# Patient Record
Sex: Female | Born: 1965 | Race: Black or African American | Hispanic: No | Marital: Married | Smoking: Never smoker
Health system: Southern US, Community
[De-identification: ages and names within clinical notes are randomized; demographics above are authoritative.]

## PROBLEM LIST (undated history)

## (undated) DIAGNOSIS — E559 Vitamin D deficiency, unspecified: Secondary | ICD-10-CM

## (undated) DIAGNOSIS — Q8740 Marfan's syndrome, unspecified: Secondary | ICD-10-CM

## (undated) DIAGNOSIS — D649 Anemia, unspecified: Secondary | ICD-10-CM

## (undated) DIAGNOSIS — K5190 Ulcerative colitis, unspecified, without complications: Secondary | ICD-10-CM

## (undated) DIAGNOSIS — N6009 Solitary cyst of unspecified breast: Secondary | ICD-10-CM

## (undated) DIAGNOSIS — N200 Calculus of kidney: Secondary | ICD-10-CM

## (undated) DIAGNOSIS — I719 Aortic aneurysm of unspecified site, without rupture: Secondary | ICD-10-CM

## (undated) HISTORY — DX: Vitamin D deficiency, unspecified: E55.9

## (undated) HISTORY — PX: HERNIA REPAIR: SHX51

## (undated) HISTORY — DX: Solitary cyst of unspecified breast: N60.09

## (undated) HISTORY — PX: ABDOMINAL AORTIC ANEURYSM REPAIR: SUR1152

## (undated) HISTORY — DX: Ulcerative colitis, unspecified, without complications: K51.90

## (undated) HISTORY — DX: Calculus of kidney: N20.0

## (undated) HISTORY — DX: Aortic aneurysm of unspecified site, without rupture: I71.9

---

## 2010-08-23 HISTORY — PX: COLONOSCOPY: SHX174

## 2014-08-23 DIAGNOSIS — N6009 Solitary cyst of unspecified breast: Secondary | ICD-10-CM

## 2014-08-23 HISTORY — DX: Solitary cyst of unspecified breast: N60.09

## 2015-03-20 ENCOUNTER — Ambulatory Visit
Admission: EM | Admit: 2015-03-20 | Discharge: 2015-03-20 | Disposition: A | Discharge status: Home / Self Care | Payer: BC Managed Care – PPO | Attending: Internal Medicine | Admitting: Internal Medicine

## 2015-03-20 ENCOUNTER — Encounter: Payer: Self-pay | Admitting: Emergency Medicine

## 2015-03-20 DIAGNOSIS — R319 Hematuria, unspecified: Secondary | ICD-10-CM | POA: Insufficient documentation

## 2015-03-20 DIAGNOSIS — N390 Urinary tract infection, site not specified: Secondary | ICD-10-CM

## 2015-03-20 DIAGNOSIS — K2100 Gastro-esophageal reflux disease with esophagitis, without bleeding: Secondary | ICD-10-CM

## 2015-03-20 DIAGNOSIS — R310 Gross hematuria: Secondary | ICD-10-CM

## 2015-03-20 HISTORY — DX: Anemia, unspecified: D64.9

## 2015-03-20 LAB — URINALYSIS COMPLETE WITH MICROSCOPIC (ARMC ONLY)
Glucose, UA: NEGATIVE mg/dL
Leukocytes, UA: NEGATIVE
Nitrite: NEGATIVE
PROTEIN: 100 mg/dL — AB
Specific Gravity, Urine: 1.02 (ref 1.005–1.030)
WBC, UA: NONE SEEN WBC/hpf (ref <3)
pH: 5.5 (ref 5.0–8.0)

## 2015-03-20 MED ORDER — NITROFURANTOIN MONOHYD MACRO 100 MG PO CAPS: 100.0000 mg | ORAL_CAPSULE | Freq: Two times a day (BID) | ORAL | Status: DC

## 2015-03-20 MED ORDER — OMEPRAZOLE 40 MG PO CPDR: 40.0000 mg | DELAYED_RELEASE_CAPSULE | Freq: Two times a day (BID) | ORAL | Status: DC

## 2015-03-20 MED ORDER — FLUCONAZOLE 200 MG PO TABS: 200.0000 mg | ORAL_TABLET | Freq: Once | ORAL | Status: DC

## 2015-03-20 NOTE — Discharge Instructions (Signed)
Prescriptions for omeprazole (stomach acid medicine), macrobid (antibiotic) and diflucan (yeast medicine) were sent to the [facility] in [place].

## 2015-03-20 NOTE — ED Provider Notes (Signed)
CSN: [id number]     Arrival date & time 03/20/15  1539 History   First MD Initiated Contact with Patient 03/20/15 1648     Chief Complaint  Patient presents with  . Hematuria    HPI  49yo lady with recent dx anemia; presents with hematuria, dysuria, today. No fever.  Past Medical History  Diagnosis Date  . Anemia    History reviewed. No pertinent past surgical history. History reviewed. No pertinent family history. History  Substance Use Topics  . Smoking status: Never Smoker   . Smokeless tobacco: Not on file  . Alcohol Use: No    Review of Systems  All other systems reviewed and are negative.   Allergies  Erythromycin and Penicillins  Home Medications   Prior to Admission medications   Medication Sig Start Date End Date Taking? Authorizing Provider  Cholecalciferol (VITAMIN D-3) 1000 UNITS CAPS Take 1 capsule by mouth daily.   Yes Historical Provider, MD  ferrous fumarate (HEMOCYTE - 106 MG FE) 325 (106 FE) MG TABS tablet Take 1 tablet by mouth 3 (three) times daily.   Yes Historical Provider, MD  Vitamin D, Ergocalciferol, (DRISDOL) 50000 UNITS CAPS capsule Take 50,000 Units by mouth every 7 (seven) days.   Yes Historical Provider, MD                        BP 120/79 mmHg  Pulse 87  Temp(Src) 98.2 F (36.8 C) (Oral)  Resp 16  SpO2 100% Physical Exam  Constitutional: She is oriented to person, place, and time. No distress.  Alert, nicely groomed  HENT:  Head: Atraumatic.  Eyes:  Conjugate gaze, no eye redness/drainage  Neck: Neck supple.  Cardiovascular: Normal rate and regular rhythm.   Pulmonary/Chest: No respiratory distress.  Lungs clear, symmetric breath sounds  Abdominal: Soft. She exhibits no distension. There is no tenderness.  Musculoskeletal: Normal range of motion.  No leg swelling  Neurological: She is alert and oriented to person, place, and time.  Skin: Skin is warm and dry.  No cyanosis  Nursing note and vitals reviewed.   ED Course   Procedures Results for orders placed or performed during the hospital encounter of 03/20/15  Urinalysis complete, with microscopic  Result Value Ref Range   Color, Urine RED (A) YELLOW   APPearance CLOUDY (A) CLEAR   Glucose, UA NEGATIVE NEGATIVE mg/dL   Bilirubin Urine 1+ (A) NEGATIVE   Ketones, ur TRACE (A) NEGATIVE mg/dL   Specific Gravity, Urine 1.020 1.005 - 1.030   Hgb urine dipstick 3+ (A) NEGATIVE   pH 5.5 5.0 - 8.0   Protein, ur 100 (A) NEGATIVE mg/dL   Nitrite NEGATIVE NEGATIVE   Leukocytes, UA NEGATIVE NEGATIVE   RBC / HPF TOO NUMEROUS TO COUNT <3 RBC/hpf   WBC, UA NONE SEEN <3 WBC/hpf   Bacteria, UA FEW (A) RARE   Squamous Epithelial / LPF 0-5 (A) RARE   Budding Yeast PRESENT    Urine culture pending.  MDM   1. Hematuria, gross   2. UTI (lower urinary tract infection)   3. Gastroesophageal reflux disease with esophagitis    Discharge Medication List as of 03/20/2015  5:14 PM    START taking these medications   Details  fluconazole (DIFLUCAN) 200 MG tablet Take 1 tablet (200 mg total) by mouth once., Starting 03/20/2015, Normal    nitrofurantoin, macrocrystal-monohydrate, (MACROBID) 100 MG capsule Take 1 capsule (100 mg total) by mouth 2 (two) times daily.,  Starting 03/20/2015, Until Discontinued, Normal    omeprazole (PRILOSEC) 40 MG capsule Take 1 capsule (40 mg total) by mouth 2 (two) times daily before a meal., Starting 03/20/2015, Until Discontinued, Normal       rx as above.   Would like to establish with a local pcp; given Dr Chinita Greenland office number to facilitate.    Sherlene Shams, MD 03/30/15 [phone]

## 2015-03-20 NOTE — ED Notes (Signed)
Pt states that she noted blood in her urine today, she states that she was at her PCP on 03/18/2015 and they told her she was anemic. Pt does c/o burning and irritation in her urethra per pt

## 2015-03-24 ENCOUNTER — Telehealth: Payer: Self-pay

## 2015-03-24 NOTE — ED Notes (Signed)
Patient called for urine culture result. Not able to find that culture had been ordered. Dr. Valere Dross working and given update. She recommends patient get established with a Primary Care doctor. Dr. Chinita Greenland telephone number given to her. Not able to collect another urine for culture at this time due to patient being on Nitrofurantoin.

## 2015-03-26 ENCOUNTER — Encounter: Payer: Self-pay | Admitting: Family Medicine

## 2015-03-26 ENCOUNTER — Ambulatory Visit (INDEPENDENT_AMBULATORY_CARE_PROVIDER_SITE_OTHER): Payer: BC Managed Care – PPO | Admitting: Family Medicine

## 2015-03-26 VITALS — BP 120/82 | HR 80 | Ht 69.0 in | Wt 281.0 lb

## 2015-03-26 DIAGNOSIS — N200 Calculus of kidney: Secondary | ICD-10-CM

## 2015-03-26 DIAGNOSIS — E559 Vitamin D deficiency, unspecified: Secondary | ICD-10-CM | POA: Diagnosis not present

## 2015-03-26 DIAGNOSIS — D509 Iron deficiency anemia, unspecified: Secondary | ICD-10-CM | POA: Diagnosis not present

## 2015-03-26 DIAGNOSIS — R319 Hematuria, unspecified: Secondary | ICD-10-CM | POA: Diagnosis not present

## 2015-03-26 DIAGNOSIS — K51919 Ulcerative colitis, unspecified with unspecified complications: Secondary | ICD-10-CM | POA: Diagnosis not present

## 2015-03-26 LAB — POCT URINALYSIS DIPSTICK

## 2015-03-26 MED ORDER — CIPROFLOXACIN HCL 250 MG PO TABS: 250.0000 mg | ORAL_TABLET | Freq: Two times a day (BID) | ORAL | Status: DC

## 2015-03-27 DIAGNOSIS — N200 Calculus of kidney: Secondary | ICD-10-CM | POA: Insufficient documentation

## 2015-03-27 DIAGNOSIS — D509 Iron deficiency anemia, unspecified: Secondary | ICD-10-CM | POA: Insufficient documentation

## 2015-03-27 DIAGNOSIS — E559 Vitamin D deficiency, unspecified: Secondary | ICD-10-CM | POA: Insufficient documentation

## 2015-03-27 DIAGNOSIS — K5190 Ulcerative colitis, unspecified, without complications: Secondary | ICD-10-CM | POA: Insufficient documentation

## 2015-03-27 NOTE — Progress Notes (Signed)
Date:  03/26/2015   Name:  Cynthia Duncan   DOB:  Dec 27, 1965   MRN:  [id number]  PCP:  Adline Potter, MD    Chief Complaint: Urinary Tract Infection   History of Present Illness:  This is a 49 y.o. female with hx kidney stones recently diagnosed with iron deficiency anemia develeoped gross hematuria last week, seen [facility] and placed on Macrobid and fluconazole but no cx sent. Feeling better but reddish urine and slight dysuria persist. No flank pain noted. Lives in [place] and would like to get specialty care there.  Review of Systems:  Review of Systems  Constitutional: Negative for fever and appetite change.  HENT: Negative for ear pain and sore throat.   Eyes: Negative for pain.  Respiratory: Negative for cough and shortness of breath.   Cardiovascular: Negative for chest pain and leg swelling.  Gastrointestinal: Negative for abdominal pain.  Endocrine: Negative for polyuria.  Genitourinary: Negative for flank pain and pelvic pain.  Musculoskeletal: Negative for back pain.  Skin: Negative for rash.  Neurological: Negative for syncope and headaches.  Hematological: Negative for adenopathy.  Psychiatric/Behavioral: Negative for confusion.    Patient Active Problem List   Diagnosis Date Noted  . Ulcerative colitis 03/27/2015  . Kidney stones 03/27/2015  . Iron deficiency anemia 03/27/2015  . Vitamin D deficiency 03/27/2015    Prior to Admission medications   Medication Sig Start Date End Date Taking? Authorizing Provider  Cholecalciferol (VITAMIN D-3) 1000 UNITS CAPS Take 1 capsule by mouth daily.   Yes Historical Provider, MD  ferrous fumarate (HEMOCYTE - 106 MG FE) 325 (106 FE) MG TABS tablet Take 1 tablet by mouth 3 (three) times daily.   Yes Historical Provider, MD  fluconazole (DIFLUCAN) 200 MG tablet Take 1 tablet (200 mg total) by mouth once. 03/20/15  Yes Sherlene Shams, MD  omeprazole (PRILOSEC) 40 MG capsule Take 1 capsule (40 mg total) by mouth 2 (two) times daily  before a meal. 03/20/15  Yes Sherlene Shams, MD  ciprofloxacin (CIPRO) 250 MG tablet Take 1 tablet (250 mg total) by mouth 2 (two) times daily. 03/26/15   Adline Potter, MD    Allergies  Allergen Reactions  . Erythromycin   . Penicillins     Past Surgical History  Procedure Laterality Date  . Colonoscopy  2012    had in [place].- Dx- Ulcerative colitis    History  Substance Use Topics  . Smoking status: Never Smoker   . Smokeless tobacco: Not on file  . Alcohol Use: No    Family History  Problem Relation Age of Onset  . Heart disease Father     Medication list has been reviewed and updated.  Physical Examination: BP 120/82 mmHg  Pulse 80  Ht 5\' 9"  (1.753 m)  Wt 281 lb (127.461 kg)  BMI 41.48 kg/m2  LMP 02/27/2015 (Approximate)  Physical Exam  Constitutional: She is oriented to person, place, and time. She appears well-developed and well-nourished.  HENT:  Head: Normocephalic and atraumatic.  Left Ear: External ear normal.  Eyes: Conjunctivae are normal. Pupils are equal, round, and reactive to light.  Neck: Neck supple. No thyromegaly present.  Cardiovascular: Normal rate, regular rhythm and normal heart sounds.   Pulmonary/Chest: Effort normal and breath sounds normal.  Abdominal: Soft. She exhibits no distension and no mass. There is no tenderness.  Musculoskeletal: She exhibits no edema.  Lymphadenopathy:    She has no cervical adenopathy.  Neurological: She is alert and oriented to  person, place, and time. Coordination normal.  Skin: Skin is warm and dry. No rash noted.  Psychiatric: She has a normal mood and affect. Her behavior is normal.    Assessment and Plan:  1. Hematuria Persistent, unclear etiology, will cover with broader spectrum abx and send urine cx today, schedule urology referral in [place] - Urine Culture - POCT urinalysis dipstick - Ambulatory referral to Urology  2. Ulcerative colitis, unspecified complication No active sxs but could be  contributing to anemai  3. Kidney stones No active sxs, doubt contributing to hematuria  4. Iron deficiency anemia Possibly due to chronic hematuria (denies black stools or heavy menstrual flows), continue iron supplement, consider GI w/u if GU w/u negative  5. Vitamin D deficiency Continue supplementation  Return in about 4 weeks (around 04/23/2015).  Satira Anis. [facility]  03/27/2015

## 2015-03-28 LAB — URINE CULTURE: Organism ID, Bacteria: NO GROWTH

## 2015-03-28 LAB — PLEASE NOTE

## 2015-10-13 DIAGNOSIS — G4733 Obstructive sleep apnea (adult) (pediatric): Secondary | ICD-10-CM | POA: Insufficient documentation

## 2016-03-27 ENCOUNTER — Encounter: Payer: Self-pay | Admitting: Emergency Medicine

## 2016-03-27 ENCOUNTER — Emergency Department: Payer: BC Managed Care – PPO

## 2016-03-27 ENCOUNTER — Emergency Department
Admission: EM | Admit: 2016-03-27 | Discharge: 2016-03-27 | Payer: BC Managed Care – PPO | Attending: Emergency Medicine | Admitting: Emergency Medicine

## 2016-03-27 DIAGNOSIS — Z79899 Other long term (current) drug therapy: Secondary | ICD-10-CM | POA: Diagnosis not present

## 2016-03-27 DIAGNOSIS — R079 Chest pain, unspecified: Secondary | ICD-10-CM

## 2016-03-27 DIAGNOSIS — I7103 Dissection of thoracoabdominal aorta: Secondary | ICD-10-CM | POA: Insufficient documentation

## 2016-03-27 DIAGNOSIS — Z7951 Long term (current) use of inhaled steroids: Secondary | ICD-10-CM | POA: Diagnosis not present

## 2016-03-27 DIAGNOSIS — Z7982 Long term (current) use of aspirin: Secondary | ICD-10-CM | POA: Insufficient documentation

## 2016-03-27 LAB — CBC WITH DIFFERENTIAL/PLATELET
Basophils Absolute: 0 10*3/uL (ref 0–0.1)
Basophils Relative: 0 %
EOS ABS: 0.1 10*3/uL (ref 0–0.7)
EOS PCT: 2 %
HCT: 35.6 % (ref 35.0–47.0)
HEMOGLOBIN: 12.4 g/dL (ref 12.0–16.0)
Lymphocytes Relative: 22 %
Lymphs Abs: 1.4 10*3/uL (ref 1.0–3.6)
MCH: 27.9 pg (ref 26.0–34.0)
MCHC: 34.6 g/dL (ref 32.0–36.0)
MCV: 80.5 fL (ref 80.0–100.0)
MONOS PCT: 7 %
Monocytes Absolute: 0.5 10*3/uL (ref 0.2–0.9)
Neutro Abs: 4.3 10*3/uL (ref 1.4–6.5)
Neutrophils Relative %: 69 %
PLATELETS: 202 10*3/uL (ref 150–440)
RBC: 4.43 MIL/uL (ref 3.80–5.20)
RDW: 13.9 % (ref 11.5–14.5)
WBC: 6.3 10*3/uL (ref 3.6–11.0)

## 2016-03-27 LAB — BASIC METABOLIC PANEL
Anion gap: 8 (ref 5–15)
BUN: 17 mg/dL (ref 6–20)
CO2: 23 mmol/L (ref 22–32)
Calcium: 8.7 mg/dL — ABNORMAL LOW (ref 8.9–10.3)
Chloride: 105 mmol/L (ref 101–111)
Creatinine, Ser: 0.75 mg/dL (ref 0.44–1.00)
Glucose, Bld: 100 mg/dL — ABNORMAL HIGH (ref 65–99)
Potassium: 4.2 mmol/L (ref 3.5–5.1)
SODIUM: 136 mmol/L (ref 135–145)

## 2016-03-27 LAB — TROPONIN I

## 2016-03-27 MED ORDER — MORPHINE SULFATE (PF) 4 MG/ML IV SOLN
INTRAVENOUS | Status: AC
  Administered 2016-03-27: 4 mg via INTRAVENOUS
  Filled 2016-03-27: qty 1

## 2016-03-27 MED ORDER — MORPHINE SULFATE (PF) 4 MG/ML IV SOLN
4.0000 mg | Freq: Once | INTRAVENOUS | Status: AC
  Administered 2016-03-27: 4 mg via INTRAVENOUS
  Filled 2016-03-27: qty 1

## 2016-03-27 MED ORDER — IOPAMIDOL (ISOVUE-370) INJECTION 76%
100.0000 mL | Freq: Once | INTRAVENOUS | Status: AC | PRN
  Administered 2016-03-27: 100 mL via INTRAVENOUS

## 2016-03-27 MED ORDER — NITROGLYCERIN 0.4 MG SL SUBL
SUBLINGUAL_TABLET | SUBLINGUAL | Status: AC
  Administered 2016-03-27: 0.4 mg via SUBLINGUAL
  Filled 2016-03-27: qty 1

## 2016-03-27 MED ORDER — MORPHINE SULFATE (PF) 4 MG/ML IV SOLN
4.0000 mg | Freq: Once | INTRAVENOUS | Status: AC
  Administered 2016-03-27: 4 mg via INTRAVENOUS

## 2016-03-27 MED ORDER — NITROGLYCERIN 0.4 MG SL SUBL
0.4000 mg | SUBLINGUAL_TABLET | SUBLINGUAL | Status: DC | PRN
  Administered 2016-03-27: 0.4 mg via SUBLINGUAL

## 2016-03-27 MED ORDER — GI COCKTAIL ~~LOC~~
30.0000 mL | Freq: Once | ORAL | Status: AC
  Administered 2016-03-27: 30 mL via ORAL
  Filled 2016-03-27: qty 30

## 2016-03-27 MED ORDER — ESMOLOL HCL-SODIUM CHLORIDE 2000 MG/100ML IV SOLN
25.0000 ug/kg/min | Freq: Once | INTRAVENOUS | Status: AC
  Administered 2016-03-27: 25 ug/kg/min via INTRAVENOUS
  Filled 2016-03-27: qty 100

## 2016-03-27 NOTE — ED Triage Notes (Signed)
Patient presents to the ED with left sided chest pain radiating into her back, abdominal pain radiating into legs bilaterally.  Patient reports history of 2 aneurisms.  Patient states pain began this morning and was very sharp, that it is continuously dull but sharp when patient takes deep breaths.  Patient is alert and oriented x 4 at this time.

## 2016-03-27 NOTE — ED Notes (Signed)
Start Esmolol at this time per Archie Balboa, md

## 2016-03-27 NOTE — ED Notes (Signed)
Pt's blood pressure has decreased to 111/66 Blood pressure infusion held to bedside. Archie Balboa, MD informed and at bedside.

## 2016-03-27 NOTE — ED Notes (Signed)
Report called to Jinny Blossom, RN at [facility]

## 2016-03-27 NOTE — ED Notes (Signed)
Pt to CT at this time. Reports some relief in pain.

## 2016-03-27 NOTE — ED Notes (Addendum)
[employer] at bedside belongings sent home with pt's husband

## 2016-03-27 NOTE — ED Provider Notes (Signed)
[facility] Emergency Department Provider Note    ____________________________________________   I have reviewed the triage vital signs and the nursing notes.   HISTORY  Chief Complaint Chest Pain   History limited by: Not Limited   HPI Cynthia Duncan is a 50 y.o. female who presents to the emergency department today because of concerns for chest pain. The pain started this morning when she was lying in bed. She describes it as initially severe and sharp. It was located in her center and left chest. It did radiate to her back and down through her abdomen. It is now more of a dull pain but still present. She additionally felt weak in her legs. No nausea or vomiting with this pain. She has not appreciated any fevers. 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 She denies pain like this in the past.    Past Medical History:  Diagnosis Date  . Anemia   . Aortic aneurysm ([place])    "The starting of one in 2012"- found by using dye- 2015- no evidence of aneurysm  . Cyst of breast 2016   left  . Kidney stones   . Ulcerative colitis ([place])   . Vitamin D deficiency     Patient Active Problem List   Diagnosis Date Noted  . Ulcerative colitis ([place]) 03/27/2015  . Kidney stones 03/27/2015  . Iron deficiency anemia 03/27/2015  . Vitamin D deficiency 03/27/2015    Past Surgical History:  Procedure Laterality Date  . [place]ONOSCOPY  2012   [place] Dx- Ulcerative colitis    Prior to Admission medications   Medication Sig Start Date End Date Taking? Authorizing Provider  Cholecalciferol (VITAMIN D-3) 1000 UNITS CAPS Take 1 capsule by mouth daily.    Historical Provider, MD  ciprofloxacin (CIPRO) 250 MG tablet Take 1 tablet (250 mg total) by mouth 2 (two) times daily. 03/26/15   Adline Potter, MD  ferrous fumarate (HEMOCYTE - 106 MG FE) 325 (106 FE) MG TABS tablet Take 1 tablet by mouth 3 (three) times daily.     Historical Provider, MD  fluconazole (DIFLUCAN) 200 MG tablet Take 1 tablet (200 mg total) by mouth once. 03/20/15   Sherlene Shams, MD  omeprazole (PRILOSEC) 40 MG capsule Take 1 capsule (40 mg total) by mouth 2 (two) times daily before a meal. 03/20/15   Sherlene Shams, MD    Allergies Erythromycin and Penicillins  Family History  Problem Relation Age of Onset  . Heart disease Father     Social History Social History  Substance Use Topics  . Smoking status: Never Smoker  . Smokeless tobacco: Never Used  . Alcohol use No    Review of Systems  Constitutional: Negative for fever. Cardiovascular: Positive for chest pain. Respiratory: Negative for shortness of breath. Gastrointestinal: Negative for abdominal pain, vomiting and diarrhea. Neurological: Negative for headaches, focal weakness or numbness.  10-point ROS otherwise negative.  ____________________________________________   PHYSICAL EXAM:  VITAL SIGNS: ED Triage Vitals [03/27/16 0754]  Enc Vitals Group     BP 123/69     Pulse 73     Resp 20     Temp 98.8     Temp src      SpO2 99%     Weight      Height      Head Circumference      Peak Flow      Pain Score  7   Constitutional: Alert and oriented. Appears slightly uncomfortable. Eyes: Conjunctivae are normal. PERRL. Normal extraocular movements. ENT   Head: Normocephalic and atraumatic.   Nose: No congestion/rhinnorhea.   Mouth/Throat: Mucous membranes are moist.   Neck: No stridor. Hematological/Lymphatic/Immunilogical: No cervical lymphadenopathy. Cardiovascular: Normal rate, regular rhythm.  No murmurs, rubs, or gallops. Radial pulse 2+ bilaterally. DP pulse 2+ bilaterally. Respiratory: Normal respiratory effort without tachypnea nor retractions. Breath sounds are clear and equal bilaterally. No wheezes/rales/rhonchi. Gastrointestinal: Soft and nontender. No pulsatile mass. No distention.  Genitourinary: Deferred Musculoskeletal: Normal  range of motion in all extremities. No joint effusions.  No lower extremity tenderness nor edema. Neurologic:  Normal speech and language. No gross focal neurologic deficits are appreciated.  Skin:  Skin is warm, dry and intact. No rash noted. Psychiatric: Mood and affect are normal. Speech and behavior are normal. Patient exhibits appropriate insight and judgment.  ____________________________________________    LABS (pertinent positives/negatives)  Labs Reviewed  BASIC METABOLIC PANEL - Abnormal; Notable for the following:       Result Value   Glucose, Bld 100 (*)    Calcium 8.7 (*)    All other components within normal limits  CBC WITH DIFFERENTIAL/PLATELET  TROPONIN I     ____________________________________________   EKG  I, Nance Pear, attending physician, personally viewed and interpreted this EKG  EKG Time: 0757 Rate: 74 Rhythm: normal sinus rhythm Axis: normal Intervals: qtc 401 QRS: narrow ST changes: no st elevation, t wave inversion V1, V2 Impression: abnormal ekg   ____________________________________________    RADIOLOGY  CT angio IMPRESSION: VASCULAR  1. Positive for acute Stanford type B aortic dissection beginning at the distal origin of the left subclavian artery and extending throughout the descending thoracic and abdominal aorta to terminate in the proximal left external iliac artery. The true lumen is significantly compressed although there is at least one flap fenestration in the region of the celiac artery origin and likely a second at the level of the renal arteries resulting in relatively equal contrast opacification of the true and false lumens. Nearly all the branch vessels arise from the true lumen with the exception of the right main renal artery which arises from the false lumen. No evidence of decreased perfusion of the right kidney or any of the other organs or viscera. 2. Fusiform infrarenal abdominal aortic aneurysm  measuring up to 4 cm in maximal diameter. 3. Ectasia of the abdominal aorta and pelvic arteries suggests longstanding systemic arterial hypertension. 4. Ectatic bordering on aneurysmal ascending thoracic aorta with a maximal diameter of 3.5 cm NON VASCULAR  1. Heterogeneous and enlarged/globular uterus likely representing the presence of uterine fibroids. 2. There are a few colonic diverticula noted incidentally.  I, Nance Pear, personally discussed these images and results by phone with the on-call radiologist and used this discussion as part of my medical decision making.   ____________________________________________   PROCEDURES  .Critical Care Performed by: Nance Pear Authorized by: Nance Pear   Critical care provider statement:    Critical care time (minutes):  45   Critical care time was exclusive of:  Separately billable procedures and treating other patients   Critical care was necessary to treat or prevent imminent or life-threatening deterioration of the following conditions:  Circulatory failure   Critical care was time spent personally by me on the following activities:  Development of treatment plan with patient or surrogate, discussions with consultants, evaluation of patient's response to treatment, examination of patient, ordering and  performing treatments and interventions, ordering and review of laboratory studies, ordering and review of radiographic studies and re-evaluation of patient's condition    ____________________________________________   INITIAL IMPRESSION / ASSESSMENT AND PLAN / ED COURSE  Pertinent labs & imaging results that were available during my care of the patient were reviewed by me and considered in my medical decision making (see chart for details).  Patient presented to the emergency department today because of concerns for chest pain. Patient does have a history of thoracic and abdominal aneurysm. Will get blood work  and image chest and abdomen given history of aneurysms.  Clinical Course   Patient's CT angiogram came back positive for type b aortic dissection. The patient was written for a small drip to help control the heart rate and blood pressure. The patient's blood pressure and heart rate both improved after morphine administration as well. I did discuss with Dr. Lucky Cowboy a vascular surgery here at [facility]. He was willing to see the patient consult however the patient chose to be transferred to [facility]. I think this is certainly reasonable given their ability for cardiothoracic surgery. Discussed with Dr. Cheree Ditto at [facility] who accepted the patient and patient was transferred to their ICU. ____________________________________________   FINAL CLINICAL IMPRESSION(S) / ED DIAGNOSES  Final diagnoses:  Chest pain  Dissection of thoracoabdominal aorta [facility])     Note: This dictation was prepared with Dragon dictation. Any transcriptional errors that result from this process are unintentional    Nance Pear, MD 03/27/16 1343

## 2016-04-15 ENCOUNTER — Other Ambulatory Visit: Payer: Self-pay | Admitting: Obstetrics and Gynecology

## 2016-04-15 DIAGNOSIS — Z1231 Encounter for screening mammogram for malignant neoplasm of breast: Secondary | ICD-10-CM

## 2016-05-03 DIAGNOSIS — Q8740 Marfan's syndrome, unspecified: Secondary | ICD-10-CM | POA: Insufficient documentation

## 2016-10-24 ENCOUNTER — Emergency Department
Admission: EM | Admit: 2016-10-24 | Discharge: 2016-10-24 | Disposition: A | Discharge status: Home / Self Care | Payer: BC Managed Care – PPO | Attending: Emergency Medicine | Admitting: Emergency Medicine

## 2016-10-24 ENCOUNTER — Encounter: Payer: Self-pay | Admitting: Emergency Medicine

## 2016-10-24 DIAGNOSIS — Y999 Unspecified external cause status: Secondary | ICD-10-CM | POA: Insufficient documentation

## 2016-10-24 DIAGNOSIS — Y929 Unspecified place or not applicable: Secondary | ICD-10-CM | POA: Diagnosis not present

## 2016-10-24 DIAGNOSIS — Z79899 Other long term (current) drug therapy: Secondary | ICD-10-CM | POA: Insufficient documentation

## 2016-10-24 DIAGNOSIS — Z7982 Long term (current) use of aspirin: Secondary | ICD-10-CM | POA: Diagnosis not present

## 2016-10-24 DIAGNOSIS — X58XXXA Exposure to other specified factors, initial encounter: Secondary | ICD-10-CM | POA: Diagnosis not present

## 2016-10-24 DIAGNOSIS — S46811A Strain of other muscles, fascia and tendons at shoulder and upper arm level, right arm, initial encounter: Secondary | ICD-10-CM | POA: Insufficient documentation

## 2016-10-24 DIAGNOSIS — S4991XA Unspecified injury of right shoulder and upper arm, initial encounter: Secondary | ICD-10-CM | POA: Diagnosis present

## 2016-10-24 DIAGNOSIS — Y939 Activity, unspecified: Secondary | ICD-10-CM | POA: Diagnosis not present

## 2016-10-24 HISTORY — DX: Marfan syndrome, unspecified: Q87.40

## 2016-10-24 MED ORDER — CYCLOBENZAPRINE HCL 10 MG PO TABS
10.0000 mg | ORAL_TABLET | Freq: Once | ORAL | Status: AC
  Administered 2016-10-24: 10 mg via ORAL
  Filled 2016-10-24: qty 1

## 2016-10-24 MED ORDER — HYDROCODONE-ACETAMINOPHEN 5-325 MG PO TABS: 1.0000 | ORAL_TABLET | Freq: Four times a day (QID) | ORAL | 0 refills | Status: DC | PRN

## 2016-10-24 MED ORDER — ONDANSETRON HCL 4 MG/2ML IJ SOLN
4.0000 mg | Freq: Once | INTRAMUSCULAR | Status: AC
  Administered 2016-10-24: 4 mg via INTRAVENOUS
  Filled 2016-10-24: qty 2

## 2016-10-24 MED ORDER — IBUPROFEN 600 MG PO TABS
600.0000 mg | ORAL_TABLET | Freq: Three times a day (TID) | ORAL | 0 refills | Status: DC | PRN
Start: 2016-10-24 — End: 2022-12-13

## 2016-10-24 MED ORDER — MORPHINE SULFATE (PF) 4 MG/ML IV SOLN
4.0000 mg | Freq: Once | INTRAVENOUS | Status: AC
  Administered 2016-10-24: 4 mg via INTRAVENOUS
  Filled 2016-10-24: qty 1

## 2016-10-24 MED ORDER — CYCLOBENZAPRINE HCL 10 MG PO TABS: 10.0000 mg | ORAL_TABLET | Freq: Three times a day (TID) | ORAL | 0 refills | Status: DC | PRN

## 2016-10-24 NOTE — Discharge Instructions (Signed)
You were evaluated for right-sided neck pain, and as we discussed I suspect is due to muscle spasm. Return to emergency room immediately for any new or worsening pain, headache, weakness, numbness, swelling, skin rash, facial droop, or any other symptoms concerning to you.
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
## 2016-10-24 NOTE — ED Triage Notes (Signed)
Pt reports stiffness to neck for about a week reports was feeling better and today developed sharp pain to right side of neck radiating to right arm and headache, pt reports pain started at midnight. Pt took arthirits Tylenol at midnight and motrin at 6am Pt ambulatory to exam room with steady gait, talks in complete sentences no respiratory distress noted.

## 2016-10-24 NOTE — ED Notes (Addendum)
FIRST NURSE NOTE: Pt reports sharp pain in right side of neck radiating down to right arm that started around [place]. Pt states headache started at that time as well. Pt reports last Saturday having neck pain as well on the right side. Pt ambulating in lobby, decline wheelchair Pt denies any weakness, states it is painful to lift up her right arm.

## 2016-10-27 ENCOUNTER — Emergency Department: Payer: BC Managed Care – PPO

## 2016-10-27 ENCOUNTER — Emergency Department
Admission: EM | Admit: 2016-10-27 | Discharge: 2016-10-27 | Disposition: A | Discharge status: Home / Self Care | Payer: BC Managed Care – PPO | Attending: Emergency Medicine | Admitting: Emergency Medicine

## 2016-10-27 DIAGNOSIS — X58XXXA Exposure to other specified factors, initial encounter: Secondary | ICD-10-CM | POA: Diagnosis not present

## 2016-10-27 DIAGNOSIS — R519 Headache, unspecified: Secondary | ICD-10-CM

## 2016-10-27 DIAGNOSIS — S199XXA Unspecified injury of neck, initial encounter: Secondary | ICD-10-CM | POA: Diagnosis present

## 2016-10-27 DIAGNOSIS — S161XXA Strain of muscle, fascia and tendon at neck level, initial encounter: Secondary | ICD-10-CM | POA: Diagnosis not present

## 2016-10-27 DIAGNOSIS — Z791 Long term (current) use of non-steroidal anti-inflammatories (NSAID): Secondary | ICD-10-CM | POA: Insufficient documentation

## 2016-10-27 DIAGNOSIS — R51 Headache: Secondary | ICD-10-CM | POA: Diagnosis not present

## 2016-10-27 DIAGNOSIS — Y939 Activity, unspecified: Secondary | ICD-10-CM | POA: Diagnosis not present

## 2016-10-27 DIAGNOSIS — S161XXD Strain of muscle, fascia and tendon at neck level, subsequent encounter: Secondary | ICD-10-CM

## 2016-10-27 DIAGNOSIS — Y929 Unspecified place or not applicable: Secondary | ICD-10-CM | POA: Diagnosis not present

## 2016-10-27 DIAGNOSIS — Z7982 Long term (current) use of aspirin: Secondary | ICD-10-CM | POA: Insufficient documentation

## 2016-10-27 DIAGNOSIS — Y999 Unspecified external cause status: Secondary | ICD-10-CM | POA: Insufficient documentation

## 2016-10-27 MED ORDER — KETOROLAC TROMETHAMINE 30 MG/ML IJ SOLN
30.0000 mg | Freq: Once | INTRAMUSCULAR | Status: AC
  Administered 2016-10-27: 30 mg via INTRAMUSCULAR
  Filled 2016-10-27: qty 1

## 2016-10-27 NOTE — ED Triage Notes (Addendum)
Pt c/o HA with intermittent tingling to BL hands, with right side of neck stiffness that has been going on for over a week was seen on Sunday with similar sx. Pt states when she takes the muscle relaxer the pain and HA goes away but has not taken any today.

## 2016-10-27 NOTE — ED Notes (Signed)
AAOx3.  Skin warm and dry.  NAD 

## 2016-10-27 NOTE — ED Notes (Signed)
AAOx3.  Skin warm and dry. NAD.  Ambulates with easy and steady gait.   

## 2016-10-27 NOTE — ED Provider Notes (Signed)
[facility] Emergency Department Provider Note   ____________________________________________    I have reviewed the triage vital signs and the nursing notes.   HISTORY  Chief Complaint Headache     HPI Cynthia Duncan is a 51 y.o. female who presents with complaints of neck pain and headache. Patient was seen 4 days ago in the emergency department for the same. She reports her neck pain has improved but she continues to have intermittent headaches. She does feel better after taking her medications but reports only last about 2 hours. Yesterday she had a brief episode where she felt tingling in her fingers bilaterally but this resolved. No motor weakness. No neuro deficits. No nausea or vomiting. No vision changes.   Past Medical History:  Diagnosis Date  . Anemia   . Aortic aneurysm ([place])    "The starting of one in 2012"- found by using dye- 2015- no evidence of aneurysm  . Cyst of breast 2016   left  . Kidney stones   . Marfan syndrome   . Ulcerative colitis ([place])   . Vitamin D deficiency     Patient Active Problem List   Diagnosis Date Noted  . Ulcerative colitis ([place]) 03/27/2015  . Kidney stones 03/27/2015  . Iron deficiency anemia 03/27/2015  . Vitamin D deficiency 03/27/2015    Past Surgical History:  Procedure Laterality Date  . ABDOMINAL AORTIC ANEURYSM REPAIR    . [place]ONOSCOPY  [place]   [place] Dx- Ulcerative colitis    Prior to Admission medications   Medication Sig Start Date End Date Taking? Authorizing Provider  aspirin EC 81 MG tablet Take 81 mg by mouth every evening.    Historical Provider, MD  carvedilol (COREG) 6.25 MG tablet Take 6.25 mg by mouth 2 (two) times daily. 03/17/16   Historical Provider, MD  Cholecalciferol (VITAMIN D-3) 1000 UNITS CAPS Take 1,000 Units by mouth every morning.     Historical Provider, MD  ciprofloxacin (CIPRO) 250 MG tablet Take 1 tablet (250 mg total) by mouth 2 (two) times daily. Patient  not taking: Reported on 03/27/2016 03/26/15   Adline Potter, MD  cyclobenzaprine (FLEXERIL) 10 MG tablet Take 1 tablet (10 mg total) by mouth 3 (three) times daily as needed for muscle spasms. 10/24/16   Lisa Roca, MD  ferrous fumarate (HEMOCYTE - 106 MG FE) 325 (106 FE) MG TABS tablet Take 1 tablet by mouth 3 (three) times daily.    Historical Provider, MD  fluconazole (DIFLUCAN) 200 MG tablet Take 1 tablet (200 mg total) by mouth once. 03/20/15   Sherlene Shams, MD  fluticasone [facility]) 50 MCG/ACT nasal spray Place 2 sprays into the nose 2 (two) times daily as needed.    Historical Provider, MD  HYDROcodone-acetaminophen (NORCO/VICODIN) 5-325 MG tablet Take 1 tablet by mouth every 6 (six) hours as needed for moderate pain. 10/24/16   Lisa Roca, MD  ibuprofen (ADVIL,MOTRIN) 600 MG tablet Take 1 tablet (600 mg total) by mouth every 8 (eight) hours as needed. 10/24/16   Lisa Roca, MD  Multiple Vitamin (MULTI-VITAMINS) TABS Take 1 tablet by mouth every morning.    Historical Provider, MD  omeprazole (PRILOSEC) 40 MG capsule Take 1 capsule (40 mg total) by mouth 2 (two) times daily before a meal. Patient taking differently: Take 40 mg by mouth 2 (two) times daily as needed. For heartburn/indigestion. 03/20/15   Sherlene Shams, MD     Allergies Erythromycin and Penicillins  Family History  Problem Relation Age of Onset  . Heart disease Father     Social History Social History  Substance Use Topics  . Smoking status: Never Smoker  . Smokeless tobacco: Never Used  . Alcohol use No    Review of Systems  Constitutional: No fever/chills Eyes: No visual changes.  ENT: No sore throat. Cardiovascular: Denies chest pain.no Palpations Respiratory: Denies shortness of breath. Gastrointestinal: No nausea, no vomiting.   Genitourinary: Negative for dysuria. Musculoskeletal: As above Skin: Negative for rash. Neurological: Negative for focal weakness  10-point ROS otherwise  negative.  ____________________________________________   PHYSICAL EXAM:  VITAL SIGNS: ED Triage Vitals  Enc Vitals Group     BP 10/27/16 0929 125/87     Pulse Rate 10/27/16 0929 86     Resp 10/27/16 0929 18     Temp 10/27/16 0929 98.1 F (36.7 C)     Temp Source 10/27/16 0929 Oral     SpO2 10/27/16 0929 100 %     Weight 10/27/16 0930 295 lb (133.8 kg)     Height 10/27/16 0930 5\' 9"  (1.753 m)     Head Circumference --      Peak Flow --      Pain Score 10/27/16 0930 3     Pain Loc --      Pain Edu? --      Excl. in [place]? --     Constitutional: Alert and oriented. No acute distress. Pleasant and interactive Eyes: Conjunctivae are normal.  Head: Atraumatic. Nose: No congestion/rhinnorhea. Mouth/Throat: Mucous membranes are moist.   Neck: Pain with extreme rotation to the right, mild tenderness to palpation along the right trapezius muscle specialty at the insertion site Cardiovascular: Normal rate, regular rhythm. Grossly normal heart sounds.  Good peripheral circulation. Respiratory: Normal respiratory effort.  No retractions.   Genitourinary: deferred Musculoskeletal: No lower extremity tenderness nor edema.  Warm and well perfused Neurologic:  Normal speech and language. No gross focal neurologic deficits are appreciated. Strength and alternatives Skin:  Skin is warm, dry and intact. No rash noted. Psychiatric: Mood and affect are normal. Speech and behavior are normal.  ____________________________________________   LABS (all labs ordered are listed, but only abnormal results are displayed)  Labs Reviewed - No data to display ____________________________________________  EKG  None ____________________________________________  RADIOLOGY  CT head and cervical spine unremarkable ____________________________________________   PROCEDURES  Procedure(s) performed: No    Critical Care performed: No ____________________________________________   INITIAL  IMPRESSION / ASSESSMENT AND PLAN / ED COURSE  Pertinent labs & imaging results that were available during my care of the patient were reviewed by me and considered in my medical decision making (see chart for details).  Patient reports improving right neck pain. She is able to turn her head to the right with less pain now. She returned to the emergency department because she continues to have mild intermittent headaches. Currently she feels quite well but does note mild headache.    CT head and cervical spine unremarkable. Patient feels much better after IM Toradol. Recommend supportive care outpatient follow-up for likely muscle skeletal pain ____________________________________________   FINAL CLINICAL IMPRESSION(S) / ED DIAGNOSES  Final diagnoses:  Cervical strain, acute, subsequent encounter  Acute nonintractable headache, unspecified headache type      NEW MEDICATIONS STARTED DURING THIS VISIT:  Discharge Medication List as of 10/27/2016 12:51 PM       Note:  This document was prepared using Dragon voice recognition software and may include unintentional dictation  errors.    Lavonia Drafts, MD 10/27/16 1324

## 2017-05-09 ENCOUNTER — Encounter: Payer: Self-pay | Admitting: Emergency Medicine

## 2017-05-09 ENCOUNTER — Emergency Department: Payer: BC Managed Care – PPO

## 2017-05-09 ENCOUNTER — Emergency Department
Admission: EM | Admit: 2017-05-09 | Discharge: 2017-05-10 | Disposition: A | Discharge status: Home / Self Care | Payer: BC Managed Care – PPO | Attending: Emergency Medicine | Admitting: Emergency Medicine

## 2017-05-09 DIAGNOSIS — R319 Hematuria, unspecified: Secondary | ICD-10-CM | POA: Diagnosis not present

## 2017-05-09 DIAGNOSIS — K439 Ventral hernia without obstruction or gangrene: Secondary | ICD-10-CM | POA: Insufficient documentation

## 2017-05-09 DIAGNOSIS — Z7982 Long term (current) use of aspirin: Secondary | ICD-10-CM | POA: Insufficient documentation

## 2017-05-09 DIAGNOSIS — Z79899 Other long term (current) drug therapy: Secondary | ICD-10-CM | POA: Insufficient documentation

## 2017-05-09 DIAGNOSIS — R109 Unspecified abdominal pain: Secondary | ICD-10-CM

## 2017-05-09 DIAGNOSIS — R1032 Left lower quadrant pain: Secondary | ICD-10-CM | POA: Insufficient documentation

## 2017-05-09 LAB — URINALYSIS, COMPLETE (UACMP) WITH MICROSCOPIC
BILIRUBIN URINE: NEGATIVE
Bacteria, UA: NONE SEEN
GLUCOSE, UA: NEGATIVE mg/dL
KETONES UR: 20 mg/dL — AB
LEUKOCYTES UA: NEGATIVE
NITRITE: NEGATIVE
Protein, ur: 100 mg/dL — AB
Specific Gravity, Urine: 1.014 (ref 1.005–1.030)
Squamous Epithelial / LPF: NONE SEEN
pH: 6 (ref 5.0–8.0)

## 2017-05-09 LAB — COMPREHENSIVE METABOLIC PANEL
ALT: 11 U/L — AB (ref 14–54)
AST: 15 U/L (ref 15–41)
Albumin: 3.7 g/dL (ref 3.5–5.0)
Alkaline Phosphatase: 56 U/L (ref 38–126)
Anion gap: 11 (ref 5–15)
BILIRUBIN TOTAL: 0.4 mg/dL (ref 0.3–1.2)
BUN: 15 mg/dL (ref 6–20)
CALCIUM: 8.9 mg/dL (ref 8.9–10.3)
CHLORIDE: 102 mmol/L (ref 101–111)
CO2: 21 mmol/L — ABNORMAL LOW (ref 22–32)
CREATININE: 0.93 mg/dL (ref 0.44–1.00)
Glucose, Bld: 100 mg/dL — ABNORMAL HIGH (ref 65–99)
Potassium: 3.4 mmol/L — ABNORMAL LOW (ref 3.5–5.1)
Sodium: 134 mmol/L — ABNORMAL LOW (ref 135–145)
TOTAL PROTEIN: 7.6 g/dL (ref 6.5–8.1)

## 2017-05-09 LAB — CBC
HCT: 32.2 % — ABNORMAL LOW (ref 35.0–47.0)
Hemoglobin: 11.2 g/dL — ABNORMAL LOW (ref 12.0–16.0)
MCH: 27.2 pg (ref 26.0–34.0)
MCHC: 34.8 g/dL (ref 32.0–36.0)
MCV: 78.2 fL — AB (ref 80.0–100.0)
PLATELETS: 261 10*3/uL (ref 150–440)
RBC: 4.11 MIL/uL (ref 3.80–5.20)
RDW: 15.5 % — ABNORMAL HIGH (ref 11.5–14.5)
WBC: 8.5 10*3/uL (ref 3.6–11.0)

## 2017-05-09 LAB — LIPASE, BLOOD: Lipase: 27 U/L (ref 11–51)

## 2017-05-09 MED ORDER — MORPHINE SULFATE (PF) 4 MG/ML IV SOLN
INTRAVENOUS | Status: AC
  Administered 2017-05-09: 4 mg via INTRAVENOUS
  Filled 2017-05-09: qty 1

## 2017-05-09 MED ORDER — HYDROMORPHONE HCL 1 MG/ML IJ SOLN
1.0000 mg | Freq: Once | INTRAMUSCULAR | Status: AC
  Administered 2017-05-09: 1 mg via INTRAVENOUS

## 2017-05-09 MED ORDER — ONDANSETRON HCL 4 MG/2ML IJ SOLN
4.0000 mg | Freq: Once | INTRAMUSCULAR | Status: AC
  Administered 2017-05-09: 4 mg via INTRAVENOUS

## 2017-05-09 MED ORDER — IOPAMIDOL (ISOVUE-300) INJECTION 61%
30.0000 mL | Freq: Once | INTRAVENOUS | Status: AC
  Administered 2017-05-09: 30 mL via ORAL

## 2017-05-09 MED ORDER — CIPROFLOXACIN IN D5W 400 MG/200ML IV SOLN
400.0000 mg | Freq: Once | INTRAVENOUS | Status: AC
  Administered 2017-05-10: 400 mg via INTRAVENOUS
  Filled 2017-05-09: qty 200

## 2017-05-09 MED ORDER — MORPHINE SULFATE (PF) 4 MG/ML IV SOLN
4.0000 mg | Freq: Once | INTRAVENOUS | Status: AC
  Administered 2017-05-09: 4 mg via INTRAMUSCULAR
  Filled 2017-05-09: qty 1

## 2017-05-09 MED ORDER — HYDROMORPHONE HCL 1 MG/ML IJ SOLN
INTRAMUSCULAR | Status: AC
  Administered 2017-05-09: 1 mg via INTRAVENOUS
  Filled 2017-05-09: qty 1

## 2017-05-09 MED ORDER — ONDANSETRON HCL 4 MG/2ML IJ SOLN
INTRAMUSCULAR | Status: AC
  Filled 2017-05-09: qty 2

## 2017-05-09 MED ORDER — MORPHINE SULFATE (PF) 4 MG/ML IV SOLN
4.0000 mg | Freq: Once | INTRAVENOUS | Status: AC
  Administered 2017-05-09: 4 mg via INTRAVENOUS

## 2017-05-09 NOTE — ED Notes (Signed)
Pt states that she will sign the waiver to get her renal CT study without having a pregnancy test done.

## 2017-05-09 NOTE — ED Provider Notes (Signed)
[facility] Emergency Department Provider Note  ____________________________________________   I have reviewed the triage vital signs and the nursing notes.   HISTORY  Chief Complaint Flank Pain; Abdominal Pain; and Hematuria   History limited by: Not Limited   HPI Cynthia Duncan is a 51 y.o. female who presents to the emergency department today because of concern for flank painin hematuria. The patient states that she first started having the painand hematuria roughly 3 days ago. Initially the pain was more in the right side however when she went to urgent care and they tested for CVA tenderness she noticed that the pain was more in the left side. Since that time she has continued to have pain.it will become severe. The patient states that her urine has become more bloody and has started having blood clots. She states that she was put on Macrobid by urgent care. She has not felt like she has had any fevers. She states that she has been told she has kidney stones on previous CT scans but is unaware that she has ever based one.    Past Medical History:  Diagnosis Date  . Anemia   . Aortic aneurysm ([place])    "The starting of one in 2012"- found by using dye- 2015- no evidence of aneurysm  . Cyst of breast 2016   left  . Kidney stones   . Marfan syndrome   . Ulcerative colitis ([place])   . Vitamin D deficiency     Patient Active Problem List   Diagnosis Date Noted  . Ulcerative colitis ([place]) 03/27/2015  . Kidney stones 03/27/2015  . Iron deficiency anemia 03/27/2015  . Vitamin D deficiency 03/27/2015    Past Surgical History:  Procedure Laterality Date  . ABDOMINAL AORTIC ANEURYSM REPAIR    . COLONOSCOPY  2012   [place] in [place].- Dx- Ulcerative colitis  . HERNIA REPAIR      Prior to Admission medications   Medication Sig Start Date End Date Taking? Authorizing Provider  aspirin EC 81 MG tablet Take 81 mg by mouth every evening.    [provider]   carvedilol (COREG) 6.25 MG tablet Take 6.25 mg by mouth 2 (two) times daily. 03/17/16   [provider]  Cholecalciferol (VITAMIN D-3) 1000 UNITS CAPS Take 1,000 Units by mouth every morning.     [provider]  ciprofloxacin (CIPRO) 250 MG tablet Take 1 tablet (250 mg total) by mouth 2 (two) times daily. Patient not taking: Reported on 03/27/2016 03/26/15   Adline Potter, MD  cyclobenzaprine (FLEXERIL) 10 MG tablet Take 1 tablet (10 mg total) by mouth 3 (three) times daily as needed for muscle spasms. 10/24/16   Lisa Roca, MD  ferrous fumarate (HEMOCYTE - 106 MG FE) 325 (106 FE) MG TABS tablet Take 1 tablet by mouth 3 (three) times daily.    [provider]  fluconazole (DIFLUCAN) 200 MG tablet Take 1 tablet (200 mg total) by mouth once. 03/20/15   Sherlene Shams, MD  fluticasone (FLONASE) 50 MCG/ACT nasal spray Place 2 sprays into the nose 2 (two) times daily as needed.    [provider]  HYDROcodone-acetaminophen (NORCO/VICODIN) 5-325 MG tablet Take 1 tablet by mouth every 6 (six) hours as needed for moderate pain. 10/24/16   Lisa Roca, MD  ibuprofen (ADVIL,MOTRIN) 600 MG tablet Take 1 tablet (600 mg total) by mouth every 8 (eight) hours as needed. 10/24/16   Lisa Roca, MD  Multiple Vitamin (MULTI-VITAMINS) TABS Take 1  tablet by mouth every morning.    [provider]  omeprazole (PRILOSEC) 40 MG capsule Take 1 capsule (40 mg total) by mouth 2 (two) times daily before a meal. Patient taking differently: Take 40 mg by mouth 2 (two) times daily as needed. For heartburn/indigestion. 03/20/15   Sherlene Shams, MD    Allergies Erythromycin and Penicillins  Family History  Problem Relation Age of Onset  . Heart disease Father     Social History Social History  Substance Use Topics  . Smoking status: Never Smoker  . Smokeless tobacco: Never Used  . Alcohol use No    Review of Systems Constitutional: No fever/chills Eyes: No visual  changes. ENT: No sore throat. Cardiovascular: Denies chest pain. Respiratory: Denies shortness of breath. Gastrointestinal: Positive for left flank pain.  Genitourinary: Positive for hematuria.  Musculoskeletal: Negative for back pain. Skin: Negative for rash. Neurological: Negative for headaches, focal weakness or numbness.  ____________________________________________   PHYSICAL EXAM:  VITAL SIGNS: ED Triage Vitals  Enc Vitals Group     BP 05/09/17 1652 123/60     Pulse Rate 05/09/17 1652 66     Resp 05/09/17 1652 20     Temp 05/09/17 1652 98.1 F (36.7 C)     Temp Source 05/09/17 1652 Oral     SpO2 05/09/17 1652 100 %     Weight 05/09/17 1653 255 lb (115.7 kg)     Height 05/09/17 1653 5\' 9"  (1.753 m)     Head Circumference --      Peak Flow --      Pain Score 05/09/17 1651 9   Constitutional: Alert and oriented. Well appearing and in no distress. Eyes: Conjunctivae are normal.  ENT   Head: Normocephalic and atraumatic.   Nose: No congestion/rhinnorhea.   Mouth/Throat: Mucous membranes are moist.   Neck: No stridor. Hematological/Lymphatic/Immunilogical: No cervical lymphadenopathy. Cardiovascular: Normal rate, regular rhythm.  No murmurs, rubs, or gallops. Respiratory: Normal respiratory effort without tachypnea nor retractions. Breath sounds are clear and equal bilaterally. No wheezes/rales/rhonchi. Gastrointestinal: Soft and tender to palpation in the left side. Mild tenderness in mid abdomen. No rebound. No guarding.  Genitourinary: Deferred Musculoskeletal: Normal range of motion in all extremities. No lower extremity edema. Neurologic:  Normal speech and language. No gross focal neurologic deficits are appreciated.  Skin:  Skin is warm, dry and intact. No rash noted. Psychiatric: Mood and affect are normal. Speech and behavior are normal. Patient exhibits appropriate insight and judgment.  ____________________________________________    LABS  (pertinent positives/negatives)  Labs Reviewed  COMPREHENSIVE METABOLIC PANEL - Abnormal; Notable for the following:       Result Value   Sodium 134 (*)    Potassium 3.4 (*)    CO2 21 (*)    Glucose, Bld 100 (*)    ALT 11 (*)    All other components within normal limits  CBC - Abnormal; Notable for the following:    Hemoglobin 11.2 (*)    HCT 32.2 (*)    MCV 78.2 (*)    RDW 15.5 (*)    All other components within normal limits  URINALYSIS, COMPLETE (UACMP) WITH MICROSCOPIC - Abnormal; Notable for the following:    Color, Urine AMBER (*)    APPearance CLOUDY (*)    Hgb urine dipstick MODERATE (*)    Ketones, ur 20 (*)    Protein, ur 100 (*)    All other components within normal limits  LIPASE, BLOOD  ____________________________________________   EKG  None  ____________________________________________    RADIOLOGY  CT renal IMPRESSION:  There is mild left hydronephrosis with the generalized increased  density identified in the lower pole renal calices extending into  the proximal left ureter. This could be due to blood/ hemorrhage in  the collecting system but underlying mass is not excluded. Recommend  retrograde ureterogram on outpatient basis further evaluation.    There is midline anterior abdominal herniation of small bowel loop  just below the umbilicus with surrounding stranding, bowel  incarceration is not excluded.        ____________________________________________   PROCEDURES  Procedures  ____________________________________________   INITIAL IMPRESSION / ASSESSMENT AND PLAN / ED COURSE  Pertinent labs & imaging results that were available during my care of the patient were reviewed by me and considered in my medical decision making (see chart for details).  patient presented to the emergency department today because of concerns for hematuria and abdominal pain. Patient states she has known kidney stone disease. Because of this  and the hematuria a renal CT stone was ordered. This did show some left hydronephrosis without any obvious stone. Did show a density in the left kidney. This likely is bloody given history of bleeding. However there was also concern for incarcerated bowel. Upon my view I question if this is bowel or fluid collection. Discussed with surgery here who also thought it potentially could be fluid. Given patient's hernia surgeries did reach out to [facility]. Unfortunately there surgeon on-call is busy with emergent trauma cases and cannot review imaging currently. Will plan on getting a contrasted study. To better delineate this possible fluid collection versus bowel.  ____________________________________________   FINAL CLINICAL IMPRESSION(S) / ED DIAGNOSES  Abdominal pain  Note: This dictation was prepared with Dragon dictation. Any transcriptional errors that result from this process are unintentional     Nance Pear, MD 05/09/17 [phone]

## 2017-05-09 NOTE — ED Triage Notes (Signed)
Pt presents with ED with c/o mid upper abdominal pain and bilateral flank pain and hematuria. Pt states hematuria started 3 days ago and was seen at urgent care, pt states was given abx and told to come to ER if it got worse. Pt states hx of kidney stones at this time.

## 2017-05-10 ENCOUNTER — Emergency Department: Payer: BC Managed Care – PPO

## 2017-05-10 ENCOUNTER — Encounter: Payer: Self-pay | Admitting: Radiology

## 2017-05-10 DIAGNOSIS — R319 Hematuria, unspecified: Secondary | ICD-10-CM | POA: Diagnosis not present

## 2017-05-10 MED ORDER — IOPAMIDOL (ISOVUE-300) INJECTION 61%
100.0000 mL | Freq: Once | INTRAVENOUS | Status: AC | PRN
  Administered 2017-05-10: 100 mL via INTRAVENOUS

## 2017-05-10 MED ORDER — OXYCODONE-ACETAMINOPHEN 5-325 MG PO TABS
2.0000 | ORAL_TABLET | Freq: Once | ORAL | Status: AC
  Administered 2017-05-10: 2 via ORAL
  Filled 2017-05-10: qty 2

## 2017-05-10 MED ORDER — OXYCODONE-ACETAMINOPHEN 5-325 MG PO TABS: 1.0000 | ORAL_TABLET | Freq: Four times a day (QID) | ORAL | 0 refills | Status: DC | PRN

## 2017-05-10 NOTE — Discharge Instructions (Signed)
Please follow up with urology and your surgeon at [facility]

## 2017-05-10 NOTE — ED Notes (Signed)

## 2017-05-10 NOTE — ED Provider Notes (Addendum)
-----------------------------------------   2:21 AM on 05/10/2017 -----------------------------------------   Blood pressure 129/69, pulse 68, temperature 98.1 F (36.7 C), temperature source Oral, resp. rate 16, height 5\' 9"  (1.753 m), weight 115.7 kg (255 lb), last menstrual period 04/22/2017, SpO2 95 %.  Assuming care from Dr. Archie Balboa.  In short, Cynthia Duncan is a 51 y.o. female with a chief complaint of Flank Pain; Abdominal Pain; and Hematuria .  Refer to the original H&P for additional details.  The current plan of care is to follow up the results of the CT scan and talk to [facility] about the hernia.   Clinical Course as of May 10 222  Tue May 10, 2017  0221 1. Filling defects within the lower left renal collecting system demonstrated on excretory phase images, without definite enhancement. In the context of hematuria, this may be blood clot within the collecting system. A mass, however, would be difficult to exclude. Non emergent urologic consultation should be considered. A follow-up 3 phase CT urogram with and without contrast might be helpful once the hematuria has resolved. 2. Unchanged appearance of aortic dissection compared to 03/27/2016. 3. Multiple nonobstructing right renal calculi measuring up to 4 mm. 4. Multiple large uterine fibroids. 5. Unchanged small bowel-containing right paraumbilical hernia.   CT Abdomen Pelvis W Contrast [AW]    Clinical Course User Index [AW] Loney Hering, MD   It appears that the patient has a possible blood clot or blood in the collecting system of her left kidney. She reports though that her pain is significantly improved. I did speak with Dr. Volanda Napoleon the surgeon at [facility]. She did not feel that the patient's bowel was incarcerated given that it does not have any proximal dilation. The patient also has not having pain in that area. She states that she is concerned about a possible passed kidney stone. She stated that the  patient could follow-up in the office to repair her paraumbilical hernia. I discussed these results with the patient. It also appears that the patient may have undergone treatment for UTI recently I will have her follow-up with urology. The patient will be discharged home.    Loney Hering, MD 05/10/17 6256    Loney Hering, MD 05/10/17 [phone]

## 2017-05-10 NOTE — ED Notes (Signed)
Pt went to c-t 

## 2017-05-11 ENCOUNTER — Ambulatory Visit (INDEPENDENT_AMBULATORY_CARE_PROVIDER_SITE_OTHER): Payer: BC Managed Care – PPO | Admitting: Urology

## 2017-05-11 ENCOUNTER — Encounter: Payer: Self-pay | Admitting: Urology

## 2017-05-11 VITALS — BP 122/68 | HR 81 | Ht 69.0 in | Wt 261.6 lb

## 2017-05-11 DIAGNOSIS — R310 Gross hematuria: Secondary | ICD-10-CM

## 2017-05-11 DIAGNOSIS — N200 Calculus of kidney: Secondary | ICD-10-CM | POA: Diagnosis not present

## 2017-05-11 LAB — URINE CULTURE: Culture: NO GROWTH

## 2017-05-11 NOTE — Progress Notes (Signed)
05/11/2017 3:58 PM   Cynthia Duncan 1965/11/29 [id number]  Referring provider: Wayland Denis, PA-C [address] [place], [place] [address]  Chief Complaint  Patient presents with  . Hematuria    HPI: The patient is a 51 year old female presents today for urological follow-up for gross hematuria. She was noticing right flank pain and gross hematuria with clots. She has had gross hematuria intermittently since 2010. She had a negative hematuria workup with cystoscopy and she'll 2010. She has had multiple negative urine cultures.   Currently she continues to pass clots with gross hematuria. She has left flank pain as well. She is never had surgery for kidney stones. She does know she has 2 stones in the right kidney recommended many years.  She underwent a CT abdomen pelvis with contrast only in the emergency department which did show a filling defect in the left lower renal collecting system without definitive enhancement. This was seen on a delayed image that only visualized the kidneys and upper ureter.  There is also a 4 mm and 2 mm nonobstructing right renal calculi.   PMH: Past Medical History:  Diagnosis Date  . Anemia   . Aortic aneurysm ([place])    "The starting of one in 2012"- found by using dye- 2015- no evidence of aneurysm  . Cyst of breast 2016   left  . Kidney stones   . Marfan syndrome   . Ulcerative colitis ([place])   . Vitamin D deficiency     Surgical History: Past Surgical History:  Procedure Laterality Date  . ABDOMINAL AORTIC ANEURYSM REPAIR    . COLONOSCOPY  [place]   [place] in [place] Dx- Ulcerative colitis  . HERNIA REPAIR      Home Medications:  Allergies as of 05/11/2017      Reactions   Erythromycin Diarrhea, Other (See Comments)   Severe abdominal pain.   Penicillins Swelling   Has patient had a PCN reaction causing immediate rash, facial/tongue/throat swelling, SOB or lightheadedness with hypotension: Yes Has patient had a PCN  reaction causing severe rash involving mucus membranes or skin necrosis: No Has patient had a PCN reaction that required hospitalization No Has patient had a PCN reaction occurring within the last 10 years: No If all of the above answers are "NO", then may proceed with Cephalosporin use.      Medication List       Accurate as of 05/11/17  3:58 PM. Always use your most recent med list.          aspirin EC 81 MG tablet Take 81 mg by mouth every evening.   atorvastatin 20 MG tablet Commonly known as:  LIPITOR Take 20 mg by mouth daily.   carvedilol 6.25 MG tablet Commonly known as:  COREG Take 6.25 mg by mouth 2 (two) times daily.   cyclobenzaprine 10 MG tablet Commonly known as:  FLEXERIL Take 1 tablet (10 mg total) by mouth 3 (three) times daily as needed for muscle spasms.   ferrous fumarate 325 (106 Fe) MG Tabs tablet Commonly known as:  HEMOCYTE - 106 mg FE Take 1 tablet by mouth 3 (three) times daily.   fluconazole 200 MG tablet Commonly known as:  DIFLUCAN Take 1 tablet (200 mg total) by mouth once.   fluticasone 50 MCG/ACT nasal spray Commonly known as:  FLONASE Place 2 sprays into the nose 2 (two) times daily as needed.   ibuprofen 600 MG tablet Commonly known as:  ADVIL,MOTRIN Take 1 tablet (600  mg total) by mouth every 8 (eight) hours as needed.   MULTI-VITAMINS Tabs Take 1 tablet by mouth every morning.   nitrofurantoin 100 MG capsule Commonly known as:  MACRODANTIN Take 100 mg by mouth 2 (two) times daily.   omeprazole 40 MG capsule Commonly known as:  PRILOSEC Take 1 capsule (40 mg total) by mouth 2 (two) times daily before a meal.   oxyCODONE-acetaminophen 5-325 MG tablet Commonly known as:  ROXICET Take 1 tablet by mouth every 6 (six) hours as needed.   Vitamin D-3 1000 units Caps Take 1,000 Units by mouth every morning.            Discharge Care Instructions        Start     Ordered   05/11/17 0000  Urinalysis, Complete      05/11/17 1506      Allergies:  Allergies  Allergen Reactions  . Erythromycin Diarrhea and Other (See Comments)    Severe abdominal pain.  Marland Kitchen Penicillins Swelling    Has patient had a PCN reaction causing immediate rash, facial/tongue/throat swelling, SOB or lightheadedness with hypotension: Yes Has patient had a PCN reaction causing severe rash involving mucus membranes or skin necrosis: No Has patient had a PCN reaction that required hospitalization No Has patient had a PCN reaction occurring within the last 10 years: No If all of the above answers are "NO", then may proceed with Cephalosporin use.    Family History: Family History  Problem Relation Age of Onset  . Heart disease Father   . Tuberculosis Father   . Bladder Cancer Neg Hx   . Kidney cancer Neg Hx     Social History:  reports that she has never smoked. She has never used smokeless tobacco. She reports that she does not drink alcohol or use drugs.  ROS: UROLOGY Frequent Urination?: Yes Hard to postpone urination?: No Burning/pain with urination?: No Get up at night to urinate?: Yes Leakage of urine?: Yes Urine stream starts and stops?: No Trouble starting stream?: No Do you have to strain to urinate?: No Blood in urine?: Yes Urinary tract infection?: Yes Sexually transmitted disease?: No Injury to kidneys or bladder?: No Painful intercourse?: No Weak stream?: No Currently pregnant?: No Vaginal bleeding?: No Last menstrual period?: n  Gastrointestinal Nausea?: Yes Vomiting?: No Indigestion/heartburn?: Yes Diarrhea?: Yes Constipation?: Yes  Constitutional Fever: No Night sweats?: No Weight loss?: No Fatigue?: No  Skin Skin rash/lesions?: No Itching?: No  Eyes Blurred vision?: Yes Double vision?: No  Ears/Nose/Throat Sore throat?: No Sinus problems?: No  Hematologic/Lymphatic Swollen glands?: No Easy bruising?: No  Cardiovascular Leg swelling?: No Chest pain?:  No  Respiratory Cough?: No Shortness of breath?: No  Endocrine Excessive thirst?: No  Musculoskeletal Back pain?: No Joint pain?: No  Neurological Headaches?: No Dizziness?: No  Psychologic Depression?: No Anxiety?: No  Physical Exam: BP 122/68 (BP Location: Left Arm, Patient Position: Sitting, Cuff Size: Large)   Pulse 81   Ht 5\' 9"  (1.753 m)   Wt 261 lb 9.6 oz (118.7 kg)   LMP 04/22/2017 Comment: preg test waiver signed  BMI 38.63 kg/m   Constitutional:  Alert and oriented, No acute distress. HEENT: [place] AT, moist mucus membranes.  Trachea midline, no masses. Cardiovascular: No clubbing, cyanosis, or edema. Respiratory: Normal respiratory effort, no increased work of breathing. GI: Abdomen is soft, nontender, nondistended, no abdominal masses GU: No CVA tenderness.  Skin: No rashes, bruises or suspicious lesions. Lymph: No cervical or inguinal adenopathy. Neurologic:  Grossly intact, no focal deficits, moving all 4 extremities. Psychiatric: Normal mood and affect.  Laboratory Data: Lab Results  Component Value Date   WBC 8.5 05/09/2017   HGB 11.2 (L) 05/09/2017   HCT 32.2 (L) 05/09/2017   MCV 78.2 (L) 05/09/2017   PLT 261 05/09/2017    Lab Results  Component Value Date   CREATININE 0.93 05/09/2017    No results found for: PSA  No results found for: TESTOSTERONE  No results found for: HGBA1C  Urinalysis    Component Value Date/Time   COLORURINE AMBER (A) 05/09/2017 1651   APPEARANCEUR CLOUDY (A) 05/09/2017 1651   LABSPEC 1.014 05/09/2017 1651   PHURINE 6.0 05/09/2017 1651   GLUCOSEU NEGATIVE 05/09/2017 1651   HGBUR MODERATE (A) 05/09/2017 1651   BILIRUBINUR NEGATIVE 05/09/2017 1651   KETONESUR 20 (A) 05/09/2017 1651   PROTEINUR 100 (A) 05/09/2017 1651   NITRITE NEGATIVE 05/09/2017 1651   LEUKOCYTESUR NEGATIVE 05/09/2017 1651   Imaging: CT reviewed as above  Assessment & Plan:    1. Gross hematuria 2. Filling defect in the left lower  pole 3. Nonobstructive right nephrolithiasis I discussed the patient that her gross hematuria would not be from the small nonobstructing stones in the right. Her left flank pain and gross hematuria is likely related to the filling defect seen in the left lower pole on her recent CT scan. I discussed the best way to work this up would be direct visualization though repeat CT urogram in the future would also be a reasonable option. At this point, she is elected to undergo direct visualization. We have scheduled him for cystoscopy, left ureteroscopy, possible tumor biopsy, possible laser fulguration of tumor, and left ureteral stent. All risks benefits, indications the procedure prescribed great detail. All questions were answered. The patient has agreed to proceed.   Nickie Retort, MD  [facility] [address] [place], [place] [address] [phone]

## 2017-05-12 ENCOUNTER — Other Ambulatory Visit: Payer: Self-pay | Admitting: Radiology

## 2017-05-12 DIAGNOSIS — R310 Gross hematuria: Secondary | ICD-10-CM

## 2017-05-12 DIAGNOSIS — R93429 Abnormal radiologic findings on diagnostic imaging of unspecified kidney: Secondary | ICD-10-CM

## 2017-05-12 LAB — URINALYSIS, COMPLETE
Bilirubin, UA: NEGATIVE
Glucose, UA: NEGATIVE
NITRITE UA: NEGATIVE
SPEC GRAV UA: 1.02 (ref 1.005–1.030)
Urobilinogen, Ur: 0.2 mg/dL (ref 0.2–1.0)
pH, UA: 5 (ref 5.0–7.5)

## 2017-05-12 LAB — MICROSCOPIC EXAMINATION: EPITHELIAL CELLS (NON RENAL): NONE SEEN /HPF (ref 0–10)

## 2017-05-13 ENCOUNTER — Telehealth: Payer: Self-pay | Admitting: Radiology

## 2017-05-13 ENCOUNTER — Other Ambulatory Visit: Payer: Self-pay | Admitting: Radiology

## 2017-05-13 DIAGNOSIS — R310 Gross hematuria: Secondary | ICD-10-CM

## 2017-05-13 NOTE — Telephone Encounter (Signed)
Notified pt that per Dr Pilar Jarvis, surgery will be postponed for now & appt made for 1 month f/u with CT Urogram prior. Reason given was that due to Marfan's syndrome pt is at high risk for surgery. Questions answered. Pt voices understanding.

## 2017-05-16 ENCOUNTER — Other Ambulatory Visit: Payer: Self-pay | Admitting: Radiology

## 2017-05-16 NOTE — Telephone Encounter (Signed)
Pt called back to cancel all appointments. Pt states she is going to have surgery at [facility].

## 2017-05-17 ENCOUNTER — Inpatient Hospital Stay: Admission: RE | Admit: 2017-05-17 | Payer: BC Managed Care – PPO | Source: Ambulatory Visit

## 2017-05-20 ENCOUNTER — Encounter: Admission: RE | Payer: Self-pay | Source: Ambulatory Visit

## 2017-05-20 ENCOUNTER — Ambulatory Visit: Admission: RE | Admit: 2017-05-20 | Payer: BC Managed Care – PPO | Source: Ambulatory Visit | Admitting: Urology

## 2017-05-20 SURGERY — CYSTOURETEROSCOPY, WITH STENT INSERTION
Anesthesia: Choice | Laterality: Left

## 2017-06-16 ENCOUNTER — Ambulatory Visit: Payer: BC Managed Care – PPO

## 2018-05-19 DIAGNOSIS — I10 Essential (primary) hypertension: Secondary | ICD-10-CM | POA: Insufficient documentation

## 2018-05-21 DIAGNOSIS — Z8679 Personal history of other diseases of the circulatory system: Secondary | ICD-10-CM | POA: Insufficient documentation

## 2018-05-29 ENCOUNTER — Emergency Department: Payer: BC Managed Care – PPO

## 2018-05-29 ENCOUNTER — Emergency Department
Admission: EM | Admit: 2018-05-29 | Discharge: 2018-05-29 | Disposition: A | Discharge status: Other Acute Inpatient Hospital | Payer: BC Managed Care – PPO | Attending: Emergency Medicine | Admitting: Emergency Medicine

## 2018-05-29 ENCOUNTER — Other Ambulatory Visit: Payer: Self-pay

## 2018-05-29 DIAGNOSIS — Z7982 Long term (current) use of aspirin: Secondary | ICD-10-CM | POA: Diagnosis not present

## 2018-05-29 DIAGNOSIS — R0602 Shortness of breath: Secondary | ICD-10-CM | POA: Diagnosis present

## 2018-05-29 DIAGNOSIS — I4892 Unspecified atrial flutter: Secondary | ICD-10-CM | POA: Insufficient documentation

## 2018-05-29 DIAGNOSIS — Z79899 Other long term (current) drug therapy: Secondary | ICD-10-CM | POA: Diagnosis not present

## 2018-05-29 DIAGNOSIS — R079 Chest pain, unspecified: Secondary | ICD-10-CM

## 2018-05-29 LAB — CBC WITH DIFFERENTIAL/PLATELET
BASOS ABS: 0 10*3/uL (ref 0–0.1)
Basophils Relative: 0 %
EOS PCT: 2 %
Eosinophils Absolute: 0.2 10*3/uL (ref 0–0.7)
HCT: 25.6 % — ABNORMAL LOW (ref 35.0–47.0)
Hemoglobin: 8.2 g/dL — ABNORMAL LOW (ref 12.0–16.0)
LYMPHS ABS: 0.8 10*3/uL — AB (ref 1.0–3.6)
LYMPHS PCT: 7 %
MCH: 26.6 pg (ref 26.0–34.0)
MCHC: 31.9 g/dL — ABNORMAL LOW (ref 32.0–36.0)
MCV: 83.4 fL (ref 80.0–100.0)
MONO ABS: 0.9 10*3/uL (ref 0.2–0.9)
MONOS PCT: 8 %
NEUTROS ABS: 9.4 10*3/uL — AB (ref 1.4–6.5)
Neutrophils Relative %: 83 %
Platelets: 395 10*3/uL (ref 150–440)
RBC: 3.07 MIL/uL — ABNORMAL LOW (ref 3.80–5.20)
RDW: 14.2 % (ref 11.5–14.5)
WBC: 11.4 10*3/uL — ABNORMAL HIGH (ref 3.6–11.0)

## 2018-05-29 LAB — COMPREHENSIVE METABOLIC PANEL
ALT: 20 U/L (ref 0–44)
AST: 29 U/L (ref 15–41)
Albumin: 2.8 g/dL — ABNORMAL LOW (ref 3.5–5.0)
Alkaline Phosphatase: 90 U/L (ref 38–126)
Anion gap: 7 (ref 5–15)
BUN: 16 mg/dL (ref 6–20)
CO2: 28 mmol/L (ref 22–32)
CREATININE: 0.78 mg/dL (ref 0.44–1.00)
Calcium: 8.1 mg/dL — ABNORMAL LOW (ref 8.9–10.3)
Chloride: 98 mmol/L (ref 98–111)
Glucose, Bld: 120 mg/dL — ABNORMAL HIGH (ref 70–99)
POTASSIUM: 4.9 mmol/L (ref 3.5–5.1)
Sodium: 133 mmol/L — ABNORMAL LOW (ref 135–145)
TOTAL PROTEIN: 6.7 g/dL (ref 6.5–8.1)
Total Bilirubin: 0.8 mg/dL (ref 0.3–1.2)

## 2018-05-29 LAB — TROPONIN I: TROPONIN I: 0.04 ng/mL — AB (ref <0.03)

## 2018-05-29 LAB — BRAIN NATRIURETIC PEPTIDE: B Natriuretic Peptide: 281 pg/mL — ABNORMAL HIGH (ref 0.0–100.0)

## 2018-05-29 MED ORDER — DILTIAZEM HCL 25 MG/5ML IV SOLN
10.0000 mg | Freq: Once | INTRAVENOUS | Status: AC
  Administered 2018-05-29: 10 mg via INTRAVENOUS
  Filled 2018-05-29: qty 5

## 2018-05-29 MED ORDER — DILTIAZEM HCL-DEXTROSE 100-5 MG/100ML-% IV SOLN (PREMIX)
5.0000 mg/h | Freq: Once | INTRAVENOUS | Status: AC
  Administered 2018-05-29: 5 mg/h via INTRAVENOUS
  Filled 2018-05-29: qty 100

## 2018-05-29 NOTE — ED Provider Notes (Signed)
[facility] Emergency Department Provider Note   ____________________________________________   First MD Initiated Contact with Patient 05/29/18 0244     (approximate)  I have reviewed the triage vital signs and the nursing notes.   HISTORY  Chief Complaint Tachycardia    HPI Cynthia Duncan is a 52 y.o. female who presents to the ED from home with a chief complaint of tachycardia.  Patient has a history of Marfan's, recently status post aortic aneurysm repair at [facility] and hospitalized from 9/27 to 10/2.  Called her doctor yesterday morning for weight gain, low blood pressure and leg swelling.  Was instructed to double her Lasix dosage to 80 mg daily, hold losartan, decrease Coreg dose to 6.25 mg twice daily and limit her free water.  Reports tachycardia on and off all day.  Now with some shortness of breath.  Denies fever, chills, abdominal pain, nausea or vomiting.   Past Medical History:  Diagnosis Date  . Anemia   . Aortic aneurysm ([place])    "The starting of one in 2012"- found by using dye- 2015- no evidence of aneurysm  . Cyst of breast 2016   left  . Kidney stones   . Marfan syndrome   . Ulcerative colitis ([place])   . Vitamin D deficiency     Patient Active Problem List   Diagnosis Date Noted  . Ulcerative colitis ([place]) 03/27/2015  . Kidney stones 03/27/2015  . Iron deficiency anemia 03/27/2015  . Vitamin D deficiency 03/27/2015    Past Surgical History:  Procedure Laterality Date  . ABDOMINAL AORTIC ANEURYSM REPAIR    . COLONOSCOPY  2012   had in [place].- Dx- Ulcerative colitis  . HERNIA REPAIR      Prior to Admission medications   Medication Sig Start Date End Date Taking? Authorizing Provider  aspirin EC 81 MG tablet Take 81 mg by mouth every evening.    [provider]  atorvastatin (LIPITOR) 20 MG tablet Take 20 mg by mouth daily.    [provider]  carvedilol (COREG) 6.25 MG tablet Take 6.25 mg by mouth 2 (two) times  daily. 03/17/16   [provider]  Cholecalciferol (VITAMIN D-3) 1000 UNITS CAPS Take 1,000 Units by mouth every morning.     [provider]  cyclobenzaprine (FLEXERIL) 10 MG tablet Take 1 tablet (10 mg total) by mouth 3 (three) times daily as needed for muscle spasms. Patient not taking: Reported on 05/11/2017 10/24/16   Lisa Roca, MD  ferrous fumarate (HEMOCYTE - 106 MG FE) 325 (106 FE) MG TABS tablet Take 1 tablet by mouth 3 (three) times daily.    [provider]  fluconazole (DIFLUCAN) 200 MG tablet Take 1 tablet (200 mg total) by mouth once. Patient not taking: Reported on 05/11/2017 03/20/15   Wynona Luna, MD  fluticasone [facility]) 50 MCG/ACT nasal spray Place 2 sprays into the nose 2 (two) times daily as needed.    [provider]  ibuprofen (ADVIL,MOTRIN) 600 MG tablet Take 1 tablet (600 mg total) by mouth every 8 (eight) hours as needed. 10/24/16   Lisa Roca, MD  Multiple Vitamin (MULTI-VITAMINS) TABS Take 1 tablet by mouth every morning.    [provider]  nitrofurantoin (MACRODANTIN) 100 MG capsule Take 100 mg by mouth 2 (two) times daily.    [provider]  omeprazole (PRILOSEC) 40 MG capsule Take 1 capsule (40 mg total) by mouth 2 (two) times daily before a meal. Patient not taking: Reported  on 05/11/2017 03/20/15   Wynona Luna, MD  oxyCODONE-acetaminophen (ROXICET) 5-325 MG tablet Take 1 tablet by mouth every 6 (six) hours as needed. 05/10/17   Loney Hering, MD    Allergies Erythromycin and Penicillins  Family History  Problem Relation Age of Onset  . Heart disease Father   . Tuberculosis Father   . Bladder Cancer Neg Hx   . Kidney cancer Neg Hx     Social History Social History   Tobacco Use  . Smoking status: Never Smoker  . Smokeless tobacco: Never Used  Substance Use Topics  . Alcohol use: No    Alcohol/week: 0.0 standard drinks  . Drug use: No    Review of  Systems  Constitutional: No fever/chills Eyes: No visual changes. ENT: No sore throat. Cardiovascular: Positive for palpitations.  Denies chest pain. Respiratory: Positive for shortness of breath. Gastrointestinal: No abdominal pain.  No nausea, no vomiting.  No diarrhea.  No constipation. Genitourinary: Negative for dysuria. Musculoskeletal: Negative for back pain. Skin: Negative for rash. Neurological: Negative for headaches, focal weakness or numbness.   ____________________________________________   PHYSICAL EXAM:  VITAL SIGNS: ED Triage Vitals  Enc Vitals Group     BP 05/29/18 0238 117/66     Pulse Rate 05/29/18 0238 (!) 140     Resp 05/29/18 0238 20     Temp 05/29/18 0238 98.5 F (36.9 C)     Temp Source 05/29/18 0238 Oral     SpO2 05/29/18 0238 99 %     Weight 05/29/18 0241 282 lb (127.9 kg)     Height 05/29/18 0241 5\' 9"  (1.753 m)     Head Circumference --      Peak Flow --      Pain Score --      Pain Loc --      Pain Edu? --      Excl. in [place]? --     Constitutional: Alert and oriented.  Uncomfortable appearing and in mild acute distress. Eyes: Conjunctivae are normal. PERRL. EOMI. Head: Atraumatic. Nose: No congestion/rhinnorhea. Mouth/Throat: Mucous membranes are moist.  Oropharynx non-erythematous. Neck: No stridor.   Cardiovascular: Tachycardic rate, irregular rhythm. Grossly normal heart sounds.  Good peripheral circulation. Respiratory: Normal respiratory effort.  No retractions. Lungs CTAB. Gastrointestinal: Soft and nontender. No distention. No abdominal bruits. No CVA tenderness. Musculoskeletal: No lower extremity tenderness nor edema.  No joint effusions. Neurologic:  Normal speech and language. No gross focal neurologic deficits are appreciated. No gait instability. Skin:  Skin is warm, dry and intact. No rash noted. Psychiatric: Mood and affect are normal. Speech and behavior are normal.  ____________________________________________    LABS (all labs ordered are listed, but only abnormal results are displayed)  Labs Reviewed  CBC WITH DIFFERENTIAL/PLATELET - Abnormal; Notable for the following components:      Result Value   WBC 11.4 (*)    RBC 3.07 (*)    Hemoglobin 8.2 (*)    HCT 25.6 (*)    MCHC 31.9 (*)    Neutro Abs 9.4 (*)    Lymphs Abs 0.8 (*)    All other components within normal limits  COMPREHENSIVE METABOLIC PANEL - Abnormal; Notable for the following components:   Sodium 133 (*)    Glucose, Bld 120 (*)    Calcium 8.1 (*)    Albumin 2.8 (*)    All other components within normal limits  TROPONIN I - Abnormal; Notable for the following components:   Troponin I  0.04 (*)    All other components within normal limits  BRAIN NATRIURETIC PEPTIDE - Abnormal; Notable for the following components:   B Natriuretic Peptide 281.0 (*)    All other components within normal limits   ____________________________________________  EKG  ED ECG REPORT I, SUNG,JADE J, the attending physician, personally viewed and interpreted this ECG.   Date: 05/29/2018  EKG Time: 0239  Rate: 140  Rhythm: Atrial flutter with rapid ventricular rate   Axis: Normal  Intervals: Atrial flutter with 2:1 conduction  ST&T Change: Nonspecific  ED ECG REPORT I, SUNG,JADE J, the attending physician, personally viewed and interpreted this ECG.   Date: 05/29/2018  EKG Time: 0341  Rate: 112  Rhythm: Atrial flutter  Axis: Normal  Intervals:Atrial flutter  ST&T Change: Nonspecific   ____________________________________________  RADIOLOGY  ED MD interpretation: Tiny right apical pneumothorax  Official radiology report(s): Dg Chest Port 1 View  Result Date: 05/29/2018 CLINICAL DATA:  Shortness of breath. Fast heart rate. Recent repair of aortic aneurysm. EXAM: PORTABLE CHEST 1 VIEW COMPARISON:  Chest CT 03/27/2016. Report from chest radiograph 05/24/2018 at an outside institution. FINDINGS: Tiny right apicolateral pneumothorax.  Elevated right hemidiaphragm, as seen on prior exam and described on recent radiograph. Associated right basilar atelectasis or scarring. Post median sternotomy. Heart is enlarged, tortuous thoracic aorta. No pulmonary edema. No large pleural effusion. IMPRESSION: 1. Tiny right apicolateral pneumothorax, this is described on chest x-ray 05/24/2018 at an outside institution, however images not available for direct comparison. 2. Post median sternotomy with cardiomegaly and tortuous thoracic aorta, known aortic dissection post repair. These results were called by telephone at the time of interpretation on 05/29/2018 at 3:38 am to Wells Guiles, nurse caring for the patient who will relay the results to Dr. Beather Arbour. Electronically Signed   By: Keith Rake M.D.   On: 05/29/2018 03:39    ____________________________________________   PROCEDURES  Procedure(s) performed: None  Procedures  Critical Care performed: Yes, see critical care note(s)   CRITICAL CARE Performed by: Paulette Blanch   Total critical care time: 45 minutes  Critical care time was exclusive of separately billable procedures and treating other patients.  Critical care was necessary to treat or prevent imminent or life-threatening deterioration.  Critical care was time spent personally by me on the following activities: development of treatment plan with patient and/or surrogate as well as nursing, discussions with consultants, evaluation of patient's response to treatment, examination of patient, obtaining history from patient or surrogate, ordering and performing treatments and interventions, ordering and review of laboratory studies, ordering and review of radiographic studies, pulse oximetry and re-evaluation of patient's condition.  ____________________________________________   INITIAL IMPRESSION / ASSESSMENT AND PLAN / ED COURSE  As part of my medical decision making, I reviewed the following data within the electronic medical  record:  History obtained from family, Nursing notes reviewed and incorporated, Labs reviewed, EKG interpreted, Old chart reviewed, Radiograph reviewed and Notes from prior ED visits   52 year old female with Marfan syndrome recently status post aortic aneurysm repair who presents with new onset atrial flutter. Differential includes, but is not limited to, viral syndrome, bronchitis including COPD exacerbation, pneumonia, reactive airway disease including asthma, CHF including exacerbation with or without pulmonary/interstitial edema, pneumothorax, ACS, thoracic trauma, and pulmonary embolism.  Will obtain screening cardiac lab work, chest x-ray.  Try small bolus IV diltiazem for rate control.  Anticipate transfer back to [facility].  Clinical Course as of May 29 652  Mon May 29, 2018  0344 Heart rate 112 atrial flutter after 10 mg IV Cardizem.  Updated patient and her husband of laboratory results.  On 10/2 patient's hemoglobin/hematocrit were 7.9/25.4.  Will call [facility] transfer center for transfer.   [JS]  0401 Spoke with [place] cardiology fellow who accepts patient in transfer.  Recommend initiating IV Cardizem drip.  Updated patient and spouse.  Awaiting room assignment.   [JS]  3235 Of note, I was able to see in care everywhere that patient had a tiny right apical pneumothorax when she was discharged from the hospital recently.   [JS]  0522 Patient had transient increase of her heart rate to 130s prior to initiation of Cardizem drip.  Since then she has been hemodynamically stable with a heart rate between 90-110.  EMS transport at bedside.   [JS]    Clinical Course User Index [JS] Paulette Blanch, MD     ____________________________________________   FINAL CLINICAL IMPRESSION(S) / ED DIAGNOSES  Final diagnoses:  Atrial flutter with rapid ventricular response (HCC)  Chest pain, unspecified type  SOB (shortness of breath)     ED Discharge Orders    None       Note:  This document  was prepared using Dragon voice recognition software and may include unintentional dictation errors.    Paulette Blanch, MD 05/29/18 [phone]

## 2018-05-29 NOTE — ED Notes (Signed)
EMTALA form reviewed. 

## 2018-05-29 NOTE — ED Triage Notes (Signed)
Patient with recent repair of aortic aneurysm. Patient tonight has noticed that her heart has been racing and she is having shortness of breath. Patient was told by [facility] that her heart rate shouldn't be above a certain number and tonight it exceeded that number.

## 2018-07-04 ENCOUNTER — Telehealth (HOSPITAL_COMMUNITY): Payer: Self-pay | Admitting: *Deleted

## 2018-07-04 NOTE — Telephone Encounter (Signed)
Received cardiac rehab referral from Dr. Cheree Ditto at [facility] for pt to participate in Cardiac rehab s/p [place]ama Guard sparing root replacement - ( Marfans syndrome).  Pt with brief readmission back to [facility] for rapid heart rate transferred from [facility] ER.  Pt has completed her surgical post op appt and will see the cardiologist on 11/15.  Will have support staff send MD order to Dr. Cheree Ditto to complete, request copy of ekg, check insurance for benefits/eligibility.  Once MD referral received, will proceed with scheduling pt for Cardiac Rehab should she wish to come to [place] or [facility] - pt resides in [place]. Cherre Huger, BSN Cardiac and Training and development officer

## 2018-07-05 ENCOUNTER — Telehealth (HOSPITAL_COMMUNITY): Payer: Self-pay

## 2018-07-05 NOTE — Telephone Encounter (Signed)
Attempted to call patient in regards to Cardiac Rehab - LM on VM 

## 2018-07-05 NOTE — Telephone Encounter (Signed)
Pt insurance is active and benefits verified through [place]. Co-pay $0.00, DED $1,080.00/$1,080.00 met, out of pocket $4,388.00/$4,388.00 met, co-insurance 0%. No pre-authorization. Passport, 07/05/18 @ 2:18PM, REF# [id number]  2ndary insurance is active and benefits verified through [facility]. Co-pay $3.00, DED $0.00/$0.00 met, out of pocket $11,000.00/$1,176.18 met, co-insurance 30%. No pre-authorization. Passport, 07/05/18 @ 2:22PM, REF# [id number]

## 2019-02-19 DIAGNOSIS — I7103 Dissection of thoracoabdominal aorta: Secondary | ICD-10-CM | POA: Insufficient documentation

## 2019-02-19 DIAGNOSIS — I7143 Infrarenal abdominal aortic aneurysm, without rupture: Secondary | ICD-10-CM | POA: Insufficient documentation

## 2019-09-07 IMAGING — CT CT ABD-PELV W/ CM
2 of 5 series · 15 of 46 positions shown, 17 images · IV contrast (iopamidol)
Comparison: Noncontrast CT abdomen pelvis 05/09/2017

CTA chest abdomen pelvis 03/27/2016

CLINICAL DATA: Hematuria

EXAM:
CT ABDOMEN AND PELVIS WITH CONTRAST
TECHNIQUE: Multidetector CT imaging of the abdomen and pelvis was performed
using the standard protocol following bolus administration of
intravenous contrast.
CONTRAST:  100mL [id number] IOPAMIDOL ([id number]) INJECTION 61%

[Series 2: routine abd/pel with · axial · 0.88mm/px · z∈[-724,-269]mm · 12 of 103 slices shown, 14 images]
[im 6/103  soft-tissue]
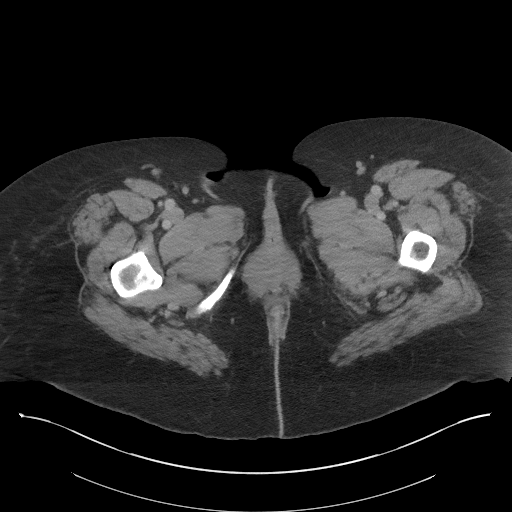
[im 6/103  bone]
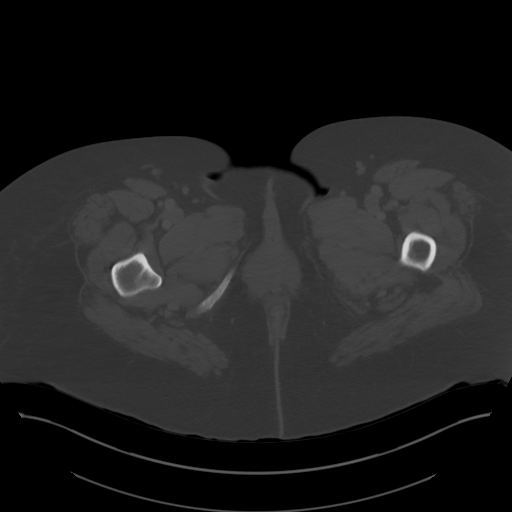
[im 17/103  soft-tissue]
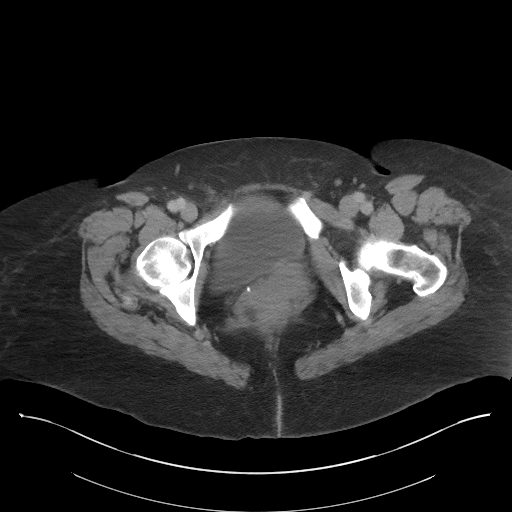
[im 22/103  soft-tissue]
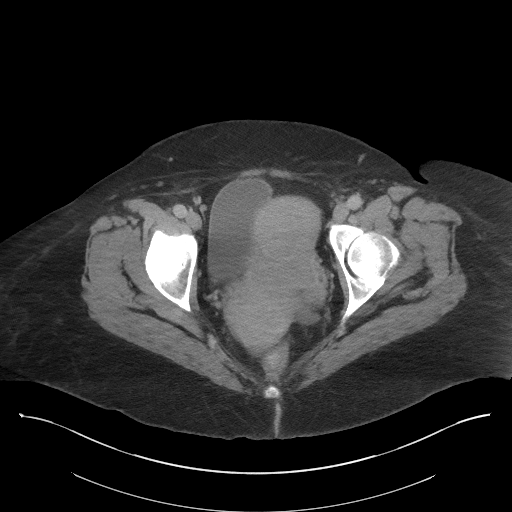
[im 33/103  soft-tissue]
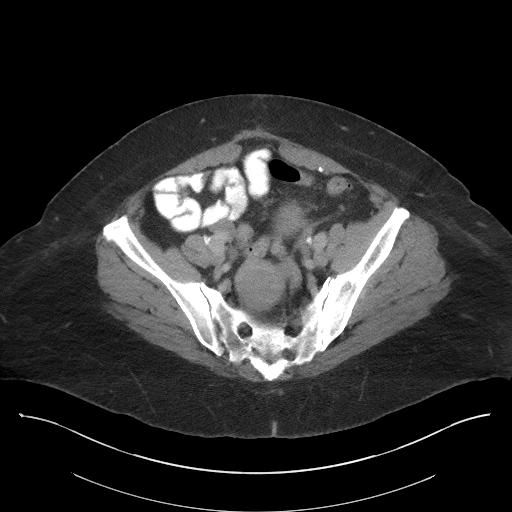
[im 38/103  soft-tissue]
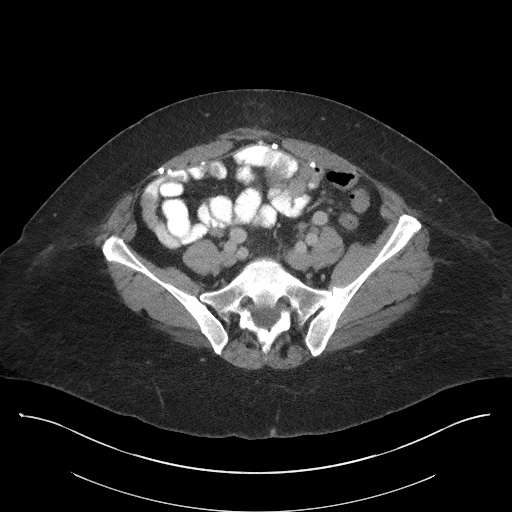
[im 49/103  soft-tissue]
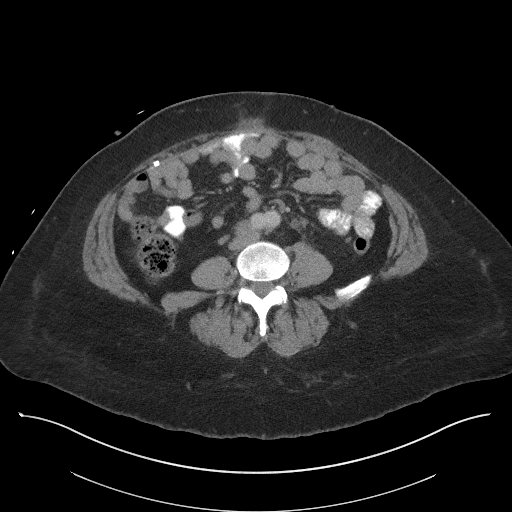
[im 54/103  soft-tissue]
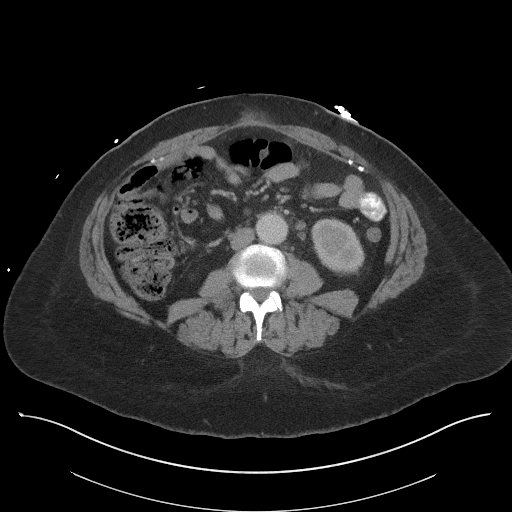
[im 65/103  soft-tissue]
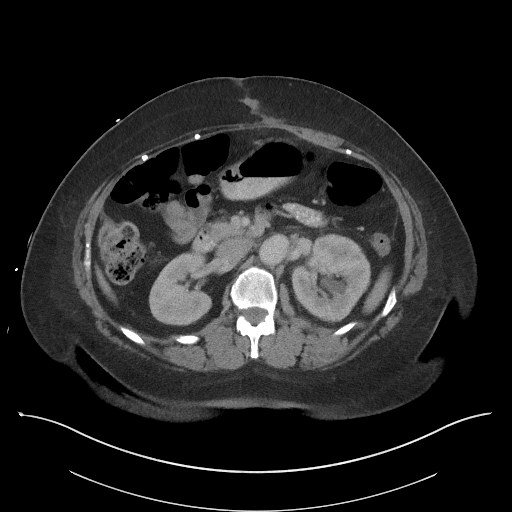
[im 70/103  soft-tissue]
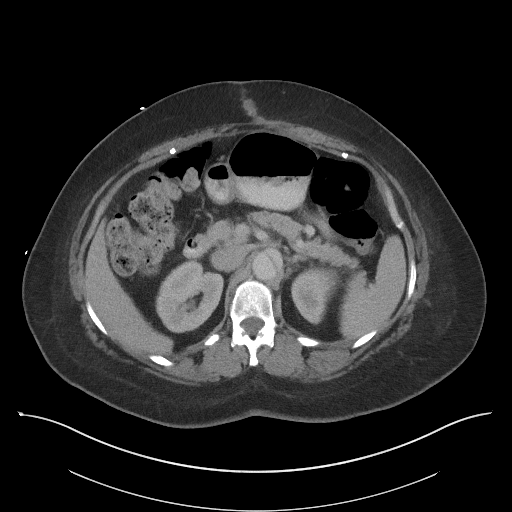
[im 70/103  bone]
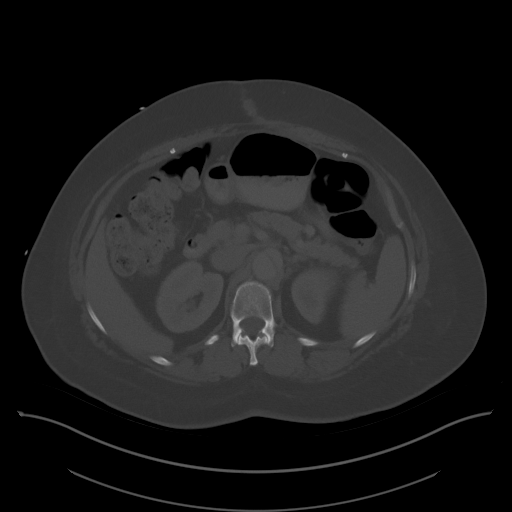
[im 81/103  soft-tissue]
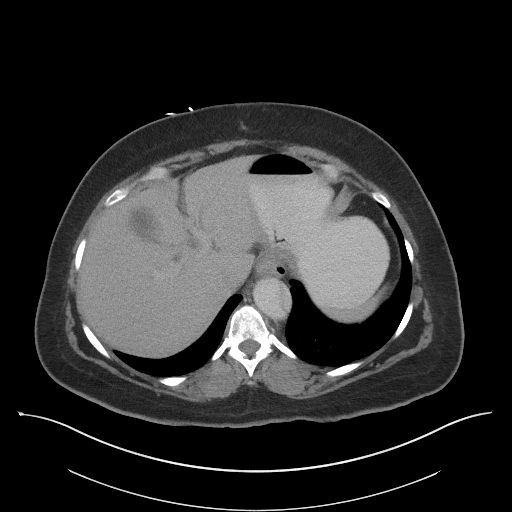
[im 86/103  soft-tissue]
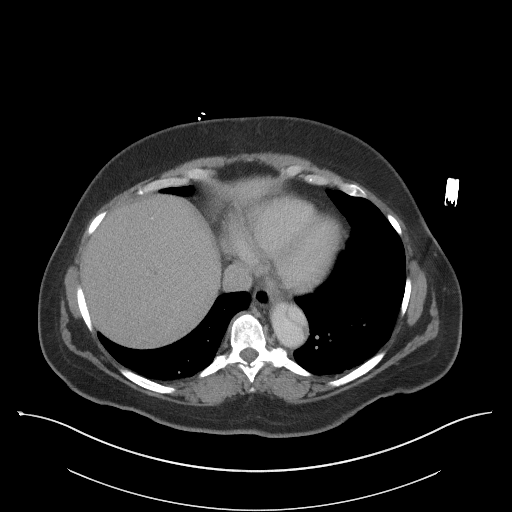
[im 97/103  soft-tissue]
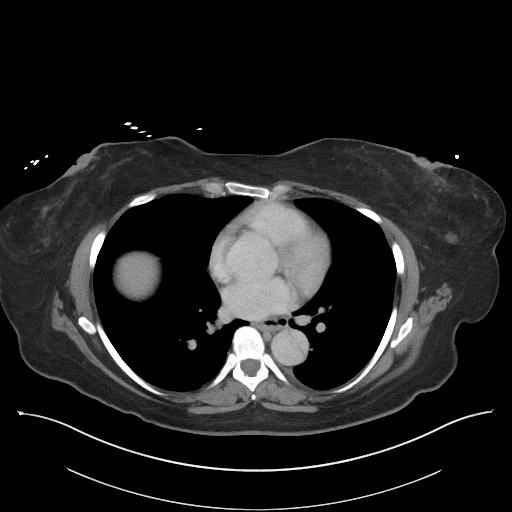

[Series 5: coronal st · coronal · 0.80mm/px · 3 of 103 slices shown]
[im 35/103  soft-tissue]
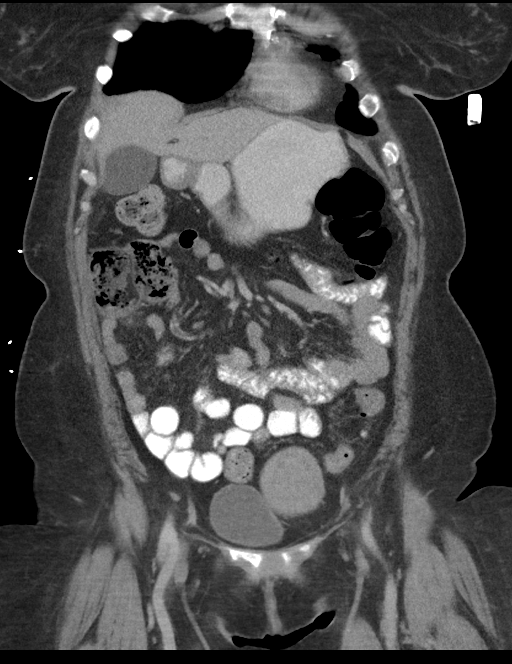
[im 46/103  soft-tissue]
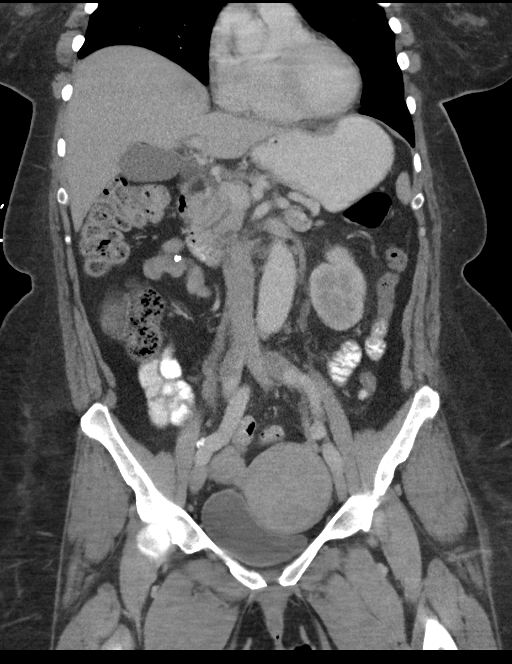
[im 57/103  soft-tissue]
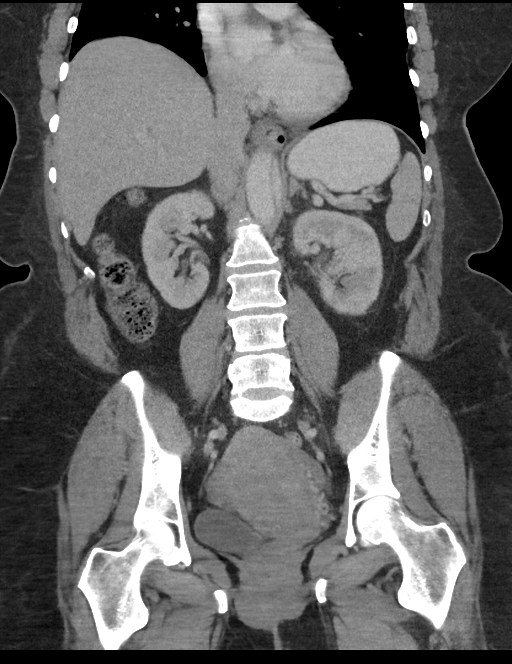

[15 of 46 positions shown; findings below may reference images not displayed]

FINDINGS: Lower chest: No pulmonary nodules or pleural effusion. No visible
pericardial effusion.

Hepatobiliary: Normal hepatic contours and density. No visible
biliary dilatation. Subcentimeter hypodensity in the right hepatic
lobe is likely a hepatic cyst. Normal gallbladder.

Pancreas: Normal contours without ductal dilatation. No
peripancreatic fluid collection.

Spleen: Normal.

Adrenals/Urinary Tract:

--Adrenal glands: Normal.

--Right kidney/ureter: There are multiple nonobstructing right renal
stones. The largest of which is located at the upper pole and
measures 4 mm. No hydronephrosis or perinephric stranding.

--Left kidney/ureter: There is no left nephrolithiasis. Comparing
the initial postcontrast images with the noncontrast study performed
on 05/09/2017, there is no clear enhancement within the renal
pelvis. Delayed images obtained in the excretory phase show filling
defects within the left lower pole collecting system (series 7
images 23- 25).

--Urinary bladder: Unremarkable.

Stomach/Bowel:

--Stomach/Duodenum: No hiatal hernia or other gastric abnormality.
Normal duodenal course and caliber.

--Small bowel: No dilatation or inflammation. Small right
paraumbilical hernia, unchanged. No evidence of obstruction.

--Colon: No focal abnormality.

--Appendix: Normal.

Vascular/Lymphatic: There is an incompletely visualized aortic
dissection that begins above the field of view and extends
inferiorly to the level of the renal arteries. The celiac axis,
superior mesenteric artery and both renal arteries arise from the
true lumen. No abdominal or pelvic lymphadenopathy.

Reproductive: There are multiple large uterine fibroids, unchanged
compared to 03/27/2016.

Musculoskeletal. No bony spinal canal stenosis or focal osseous
abnormality.

Other: Sequelae of ventral abdominal hernia repair.
IMPRESSION: 1. Filling defects within the lower left renal collecting system
demonstrated on excretory phase images, without definite
enhancement. In the context of hematuria, this may be blood clot
within the collecting system. A mass, however, would be difficult to
exclude. Non emergent urologic consultation should be considered. A
follow-up 3 phase CT urogram with and without contrast might be
helpful once the hematuria has resolved.
2. Unchanged appearance of aortic dissection compared to 03/27/2016.
3. Multiple nonobstructing right renal calculi measuring up to 4 mm.
4. Multiple large uterine fibroids.
5. Unchanged small bowel-containing right paraumbilical hernia.

## 2021-06-17 ENCOUNTER — Encounter: Payer: Self-pay | Admitting: Emergency Medicine

## 2021-06-17 ENCOUNTER — Emergency Department: Payer: Federal, State, Local not specified - PPO

## 2021-06-17 ENCOUNTER — Other Ambulatory Visit: Payer: Self-pay

## 2021-06-17 ENCOUNTER — Emergency Department
Admission: EM | Admit: 2021-06-17 | Discharge: 2021-06-17 | Disposition: A | Discharge status: Home / Self Care | Payer: Federal, State, Local not specified - PPO | Attending: Emergency Medicine | Admitting: Emergency Medicine

## 2021-06-17 DIAGNOSIS — R1031 Right lower quadrant pain: Secondary | ICD-10-CM

## 2021-06-17 DIAGNOSIS — D500 Iron deficiency anemia secondary to blood loss (chronic): Secondary | ICD-10-CM | POA: Insufficient documentation

## 2021-06-17 DIAGNOSIS — N939 Abnormal uterine and vaginal bleeding, unspecified: Secondary | ICD-10-CM | POA: Diagnosis present

## 2021-06-17 DIAGNOSIS — Z7982 Long term (current) use of aspirin: Secondary | ICD-10-CM | POA: Diagnosis not present

## 2021-06-17 DIAGNOSIS — D219 Benign neoplasm of connective and other soft tissue, unspecified: Secondary | ICD-10-CM

## 2021-06-17 DIAGNOSIS — D259 Leiomyoma of uterus, unspecified: Secondary | ICD-10-CM | POA: Diagnosis not present

## 2021-06-17 LAB — URINALYSIS, ROUTINE W REFLEX MICROSCOPIC
Bacteria, UA: NONE SEEN
Bilirubin Urine: NEGATIVE
Glucose, UA: NEGATIVE mg/dL
Ketones, ur: 5 mg/dL — AB
Nitrite: NEGATIVE
Protein, ur: 30 mg/dL — AB
Specific Gravity, Urine: 1.024 (ref 1.005–1.030)
pH: 5 (ref 5.0–8.0)

## 2021-06-17 LAB — CBC WITH DIFFERENTIAL/PLATELET
Abs Immature Granulocytes: 0.02 10*3/uL (ref 0.00–0.07)
Basophils Absolute: 0 10*3/uL (ref 0.0–0.1)
Basophils Relative: 1 %
Eosinophils Absolute: 0.1 10*3/uL (ref 0.0–0.5)
Eosinophils Relative: 2 %
HCT: 32.6 % — ABNORMAL LOW (ref 36.0–46.0)
Hemoglobin: 10.4 g/dL — ABNORMAL LOW (ref 12.0–15.0)
Immature Granulocytes: 0 %
Lymphocytes Relative: 20 %
Lymphs Abs: 1.5 10*3/uL (ref 0.7–4.0)
MCH: 26.5 pg (ref 26.0–34.0)
MCHC: 31.9 g/dL (ref 30.0–36.0)
MCV: 83 fL (ref 80.0–100.0)
Monocytes Absolute: 0.6 10*3/uL (ref 0.1–1.0)
Monocytes Relative: 8 %
Neutro Abs: 5.1 10*3/uL (ref 1.7–7.7)
Neutrophils Relative %: 69 %
Platelets: 270 10*3/uL (ref 150–400)
RBC: 3.93 MIL/uL (ref 3.87–5.11)
RDW: 13.9 % (ref 11.5–15.5)
WBC: 7.4 10*3/uL (ref 4.0–10.5)
nRBC: 0 % (ref 0.0–0.2)

## 2021-06-17 LAB — COMPREHENSIVE METABOLIC PANEL
ALT: 13 U/L (ref 0–44)
AST: 19 U/L (ref 15–41)
Albumin: 3.8 g/dL (ref 3.5–5.0)
Alkaline Phosphatase: 70 U/L (ref 38–126)
Anion gap: 7 (ref 5–15)
BUN: 21 mg/dL — ABNORMAL HIGH (ref 6–20)
CO2: 26 mmol/L (ref 22–32)
Calcium: 9.2 mg/dL (ref 8.9–10.3)
Chloride: 103 mmol/L (ref 98–111)
Creatinine, Ser: 0.88 mg/dL (ref 0.44–1.00)
GFR, Estimated: >60 mL/min (ref >60)
Glucose, Bld: 113 mg/dL — ABNORMAL HIGH (ref 70–99)
Potassium: 4.2 mmol/L (ref 3.5–5.1)
Sodium: 136 mmol/L (ref 135–145)
Total Bilirubin: 0.5 mg/dL (ref 0.3–1.2)
Total Protein: 7.6 g/dL (ref 6.5–8.1)

## 2021-06-17 LAB — POC URINE PREG, ED: Preg Test, Ur: NEGATIVE

## 2021-06-17 MED ORDER — SODIUM CHLORIDE 0.9 % IV SOLN
300.0000 mg | Freq: Once | INTRAVENOUS | Status: AC
  Administered 2021-06-17: 300 mg via INTRAVENOUS
  Filled 2021-06-17: qty 300

## 2021-06-17 MED ORDER — KETOROLAC TROMETHAMINE 30 MG/ML IJ SOLN
15.0000 mg | Freq: Once | INTRAMUSCULAR | Status: AC
  Administered 2021-06-17: 15 mg via INTRAVENOUS
  Filled 2021-06-17: qty 1

## 2021-06-17 NOTE — Discharge Instructions (Signed)
For your bleeding:  You were given a shot of IV iron today, which should help your blood levels.  I'd recommend taking IBUPROFEN 600 mg every 6 hours for the next 48 hours, then as needed. This is both for pain and to help with the fibroid bleeding.  Take this with a small amount of food.  INCREASE your iron supplements from once a day to twice a day with meals  Call your OBGYN - treatment may include surgical options at this point

## 2021-06-17 NOTE — ED Triage Notes (Addendum)
Patient ambulatory to triage with steady gait, without difficulty or distress noted; pt reports heavy vag bleeding with hx of same with anema & transfusion

## 2021-06-17 NOTE — ED Provider Notes (Signed)
[facility] Emergency Department Provider Note  ____________________________________________   Event Date/Time   First MD Initiated Contact with Patient 06/17/21 [phone]     (approximate)  I have reviewed the triage vital signs and the nursing notes.   HISTORY  Chief Complaint Vaginal Bleeding    HPI Cynthia Duncan is a 55 y.o. female with history of Marfan syndrome, uterine fibroids, here with vaginal bleeding.  The patient states that she has a history of recurrent vaginal bleeding due to fibroids.  She states that she received a Lupron injection approximately 12 days ago.  Since then, she has had some light vaginal bleeding but this has become heavier over the last several days.  She has been passing a large, half cup size clots with significant bleeding in between clots.  She has had some mild cramping but this is not persistent.  She states that she has a history of anemia requiring transfusion, and sees [facility].  Denies any lightheadedness at rest but has had some with standing.  No shortness of breath.  No cardiac history other than aortic aneurysms due to her Marfan's.  Denies any urinary symptoms.  No other complaints.  No current pain.  No fevers.    Past Medical History:  Diagnosis Date   Anemia    Aortic aneurysm ([place])    "The starting of one in 2012"- found by using dye- 2015- no evidence of aneurysm   Cyst of breast 2016   left   Kidney stones    Marfan syndrome    Ulcerative colitis ([place])    Vitamin D deficiency     Patient Active Problem List   Diagnosis Date Noted   Ulcerative colitis ([place]) 03/27/2015   Kidney stones 03/27/2015   Iron deficiency anemia 03/27/2015   Vitamin D deficiency 03/27/2015    Past Surgical History:  Procedure Laterality Date   ABDOMINAL AORTIC ANEURYSM REPAIR     COLONOSCOPY  2012   had in [place] Dx- Ulcerative colitis   HERNIA REPAIR      Prior to Admission medications   Medication Sig Start Date End Date  Taking? Authorizing Provider  aspirin EC 81 MG tablet Take 81 mg by mouth every evening.    [provider]  atorvastatin (LIPITOR) 20 MG tablet Take 20 mg by mouth daily.    [provider]  carvedilol (COREG) 6.25 MG tablet Take 6.25 mg by mouth 2 (two) times daily. 03/17/16   [provider]  Cholecalciferol (VITAMIN D-3) 1000 UNITS CAPS Take 1,000 Units by mouth every morning.     [provider]  cyclobenzaprine (FLEXERIL) 10 MG tablet Take 1 tablet (10 mg total) by mouth 3 (three) times daily as needed for muscle spasms. Patient not taking: Reported on 05/11/2017 10/24/16   Lisa Roca, MD  ferrous fumarate (HEMOCYTE - 106 MG FE) 325 (106 FE) MG TABS tablet Take 1 tablet by mouth 3 (three) times daily.    [provider]  fluconazole (DIFLUCAN) 200 MG tablet Take 1 tablet (200 mg total) by mouth once. Patient not taking: Reported on 05/11/2017 03/20/15   Wynona Luna, MD  fluticasone [facility]) 50 MCG/ACT nasal spray Place 2 sprays into the nose 2 (two) times daily as needed.    [provider]  ibuprofen (ADVIL,MOTRIN) 600 MG tablet Take 1 tablet (600 mg total) by mouth every 8 (eight) hours as needed. 10/24/16   Lisa Roca, MD  Multiple Vitamin (MULTI-VITAMINS) TABS Take 1 tablet by  mouth every morning.    [provider]  nitrofurantoin (MACRODANTIN) 100 MG capsule Take 100 mg by mouth 2 (two) times daily.    [provider]  omeprazole (PRILOSEC) 40 MG capsule Take 1 capsule (40 mg total) by mouth 2 (two) times daily before a meal. Patient not taking: Reported on 05/11/2017 03/20/15   Wynona Luna, MD  oxyCODONE-acetaminophen (ROXICET) 5-325 MG tablet Take 1 tablet by mouth every 6 (six) hours as needed. 05/10/17   Loney Hering, MD    Allergies Erythromycin and Penicillins  Family History  Problem Relation Age of Onset   Heart disease Father    Tuberculosis Father    Bladder Cancer Neg Hx     Kidney cancer Neg Hx     Social History Social History   Tobacco Use   Smoking status: Never   Smokeless tobacco: Never  Substance Use Topics   Alcohol use: No    Alcohol/week: 0.0 standard drinks   Drug use: No    Review of Systems  Review of Systems  Constitutional:  Negative for fatigue and fever.  HENT:  Negative for congestion and sore throat.   Eyes:  Negative for visual disturbance.  Respiratory:  Negative for cough and shortness of breath.   Cardiovascular:  Negative for chest pain.  Gastrointestinal:  Positive for abdominal pain. Negative for diarrhea, nausea and vomiting.  Genitourinary:  Positive for vaginal bleeding. Negative for flank pain.  Musculoskeletal:  Negative for back pain and neck pain.  Skin:  Negative for rash and wound.  Neurological:  Positive for light-headedness (With standing). Negative for weakness.  All other systems reviewed and are negative.   ____________________________________________  PHYSICAL EXAM:      VITAL SIGNS: ED Triage Vitals  Enc Vitals Group     BP 06/17/21 0455 135/75     Pulse Rate 06/17/21 0455 90     Resp 06/17/21 0455 18     Temp 06/17/21 0455 98.5 F (36.9 C)     Temp Source 06/17/21 0455 Oral     SpO2 06/17/21 0455 98 %     Weight 06/17/21 0453 257 lb (116.6 kg)     Height 06/17/21 0453 5\' 9"  (1.753 m)     Head Circumference --      Peak Flow --      Pain Score 06/17/21 0452 5     Pain Loc --      Pain Edu? --      Excl. in [place]? --      Physical Exam Vitals and nursing note reviewed.  Constitutional:      General: She is not in acute distress.    Appearance: She is well-developed.  HENT:     Head: Normocephalic and atraumatic.  Eyes:     Conjunctiva/sclera: Conjunctivae normal.  Cardiovascular:     Rate and Rhythm: Normal rate and regular rhythm.     Heart sounds: Normal heart sounds.  Pulmonary:     Effort: Pulmonary effort is normal. No respiratory distress.     Breath sounds: No wheezing.   Abdominal:     General: There is no distension.     Tenderness: There is no rebound.  Genitourinary:    Comments: No significant suprapubic or pelvic tenderness.  No external bleeding. Musculoskeletal:     Cervical back: Neck supple.  Skin:    General: Skin is warm.     Capillary Refill: Capillary refill takes less than 2 seconds.  Findings: No rash.  Neurological:     Mental Status: She is alert and oriented to person, place, and time.     Motor: No abnormal muscle tone.      ____________________________________________   LABS (all labs ordered are listed, but only abnormal results are displayed)  Labs Reviewed  CBC WITH DIFFERENTIAL/PLATELET - Abnormal; Notable for the following components:      Result Value   Hemoglobin 10.4 (*)    HCT 32.6 (*)    All other components within normal limits  COMPREHENSIVE METABOLIC PANEL - Abnormal; Notable for the following components:   Glucose, Bld 113 (*)    BUN 21 (*)    All other components within normal limits  URINALYSIS, ROUTINE W REFLEX MICROSCOPIC - Abnormal; Notable for the following components:   Color, Urine YELLOW (*)    APPearance HAZY (*)    Hgb urine dipstick SMALL (*)    Ketones, ur 5 (*)    Protein, ur 30 (*)    Leukocytes,Ua MODERATE (*)    All other components within normal limits  POC URINE PREG, ED  TYPE AND SCREEN    ____________________________________________  EKG:  ________________________________________  RADIOLOGY All imaging, including plain films, CT scans, and ultrasounds, independently reviewed by me, and interpretations confirmed via formal radiology reads.  ED MD interpretation:   Ultrasound: Fibroid uterus, multifocal  Official radiology report(s): US PELVIC COMPLETE W TRANSVAGINAL AND TORSION R/O  Result Date: 06/17/2021 CLINICAL DATA:  55 year old female with history of heavy vaginal bleeding and cramping for the past 4 days. History of fibroids. EXAM: TRANSABDOMINAL AND  TRANSVAGINAL ULTRASOUND OF PELVIS DOPPLER ULTRASOUND OF OVARIES TECHNIQUE: Both transabdominal and transvaginal ultrasound examinations of the pelvis were performed. Transabdominal technique was performed for global imaging of the pelvis including uterus, ovaries, adnexal regions, and pelvic cul-de-sac. It was necessary to proceed with endovaginal exam following the transabdominal exam to visualize the endometrium and adnexal regions. Color and duplex Doppler ultrasound was utilized to evaluate blood flow to the ovaries. COMPARISON:  No priors. FINDINGS: Uterus Measurements: 10.8 x 8.7 x 9.1 cm = volume: 442 mL. Multiple lesions of heterogeneous internal echotexture, compatible with multifocal fibroids. These measure 5.5 x 7.4 x 5.1 cm in the left side of the fundus where there is an exophytic subserosal fibroid, 6.1 x 3.5 x 3.9 cm in the lower uterine segment, and 4.7 x 5.7 x 6.1 cm in the posterior aspect of the uterine fundus. Endometrium Thickness: 9.4 mm.  No focal abnormality visualized. Right ovary Could not be visualized Left ovary Measurements: 3.2 x 1.2 x 2.2 cm = volume: 4.3 mL. Normal appearance/no adnexal mass. Pulsed Doppler evaluation of the left ovary demonstrates normal low-resistance arterial and venous waveforms. Other findings Trace volume of free fluid in the cul-de-sac. IMPRESSION: 1. Fibroid uterus, as above. 2. Right ovary could not be visualized on today's examination. 3. Normal appearance of the left ovary. 4. Trace volume of free fluid in the cul-de-sac. Electronically Signed   By: Vinnie Langton M.D.   On: 06/17/2021 07:40    ____________________________________________  PROCEDURES   Procedure(s) performed (including Critical Care):  Procedures  ____________________________________________  INITIAL IMPRESSION / MDM / [place] / ED COURSE  As part of my medical decision making, I reviewed the following data within the [facility] notes  reviewed and incorporated, Old chart reviewed, Notes from prior ED visits, and  Controlled Substance Database       *Cynthia Duncan was evaluated  in Emergency Department on 06/17/2021 for the symptoms described in the history of present illness. She was evaluated in the context of the global COVID-19 pandemic, which necessitated consideration that the patient might be at risk for infection with the SARS-CoV-2 virus that causes COVID-19. Institutional protocols and algorithms that pertain to the evaluation of patients at risk for COVID-19 are in a state of rapid change based on information released by regulatory bodies including the CDC and federal and state organizations. These policies and algorithms were followed during the patient's care in the ED.  Some ED evaluations and interventions may be delayed as a result of limited staffing during the pandemic.*     Medical Decision Making: 55 year old female with history of uterine fibroids here with vaginal bleeding.  Suspect ongoing fibroid related bleeding.  She is no longer on anticoagulation.  Hemoglobin is 10.4 which is near baseline.  She is hemodynamically stable.  Renal function normal.  No signs of ischemia.  UA shows some nonspecific inflammation but she is adamant she has had no urinary symptoms, and there are no bacteria, do not suspect UTI.  She did receive a Lupron injection approximately 12 days ago.  Given that she has an extensive history and received Lupron, will discuss with OB regarding management options.  Will have her increase her iron supplement in the setting of this bleeding.  No indication for transfusion at this time.  D/w Dr. Romelle Starcher, appreciate recommendations. No hormonal tx would be beneficial at this time. Will give a dose of venofer to supplement her chronic IDA in setting of her bleeding, and trial NSAIDs for pain/fibroid bleeding. Will have her call her OB as tx at this point may be surgical. No signs of HD instability  and given her stable Hgb and receipt of IV iron, feel she is stable to manage as an outpt.  ____________________________________________  FINAL CLINICAL IMPRESSION(S) / ED DIAGNOSES  Final diagnoses:  Fibroids  Iron deficiency anemia due to chronic blood loss     MEDICATIONS GIVEN DURING THIS VISIT:  Medications  ketorolac (TORADOL) 30 MG/ML injection 15 mg (has no administration in time range)  iron sucrose (VENOFER) 300 mg in sodium chloride 0.9 % 250 mL IVPB (has no administration in time range)     ED Discharge Orders     None        Note:  This document was prepared using Dragon voice recognition software and may include unintentional dictation errors.   Duffy Bruce, MD 06/17/21 [phone]

## 2021-06-18 LAB — TYPE AND SCREEN
ABO/RH(D): B POS
Antibody Screen: NEGATIVE
Weak D: POSITIVE

## 2022-12-09 ENCOUNTER — Ambulatory Visit
Admission: RE | Admit: 2022-12-09 | Discharge: 2022-12-09 | Disposition: A | Discharge status: Home / Self Care | Payer: Federal, State, Local not specified - PPO | Source: Ambulatory Visit | Attending: Internal Medicine | Admitting: Internal Medicine

## 2022-12-09 ENCOUNTER — Other Ambulatory Visit: Payer: Self-pay | Admitting: Internal Medicine

## 2022-12-09 ENCOUNTER — Emergency Department
Admission: EM | Admit: 2022-12-09 | Discharge: 2022-12-09 | Disposition: A | Discharge status: Home / Self Care | Payer: Federal, State, Local not specified - PPO | Attending: Emergency Medicine | Admitting: Emergency Medicine

## 2022-12-09 DIAGNOSIS — R310 Gross hematuria: Secondary | ICD-10-CM

## 2022-12-09 DIAGNOSIS — R109 Unspecified abdominal pain: Secondary | ICD-10-CM

## 2022-12-09 DIAGNOSIS — R319 Hematuria, unspecified: Secondary | ICD-10-CM | POA: Insufficient documentation

## 2022-12-09 LAB — BASIC METABOLIC PANEL
Anion gap: 9 (ref 5–15)
BUN: 16 mg/dL (ref 6–20)
CO2: 23 mmol/L (ref 22–32)
Calcium: 9 mg/dL (ref 8.9–10.3)
Chloride: 101 mmol/L (ref 98–111)
Creatinine, Ser: 1.14 mg/dL — ABNORMAL HIGH (ref 0.44–1.00)
GFR, Estimated: 57 mL/min — ABNORMAL LOW (ref >60)
Glucose, Bld: 96 mg/dL (ref 70–99)
Potassium: 3.6 mmol/L (ref 3.5–5.1)
Sodium: 133 mmol/L — ABNORMAL LOW (ref 135–145)

## 2022-12-09 LAB — URINALYSIS, ROUTINE W REFLEX MICROSCOPIC
Bacteria, UA: NONE SEEN
Bilirubin Urine: NEGATIVE
Glucose, UA: NEGATIVE mg/dL
Ketones, ur: 20 mg/dL — AB
Leukocytes,Ua: NEGATIVE
Nitrite: NEGATIVE
Protein, ur: 100 mg/dL — AB
RBC / HPF: >50 RBC/hpf (ref 0–5)
Specific Gravity, Urine: >1.046 — ABNORMAL HIGH (ref 1.005–1.030)
Squamous Epithelial / HPF: NONE SEEN /HPF (ref 0–5)
pH: 5 (ref 5.0–8.0)

## 2022-12-09 LAB — CBC WITH DIFFERENTIAL/PLATELET
Abs Immature Granulocytes: 0.02 10*3/uL (ref 0.00–0.07)
Basophils Absolute: 0 10*3/uL (ref 0.0–0.1)
Basophils Relative: 0 %
Eosinophils Absolute: 0.1 10*3/uL (ref 0.0–0.5)
Eosinophils Relative: 1 %
HCT: 35.7 % — ABNORMAL LOW (ref 36.0–46.0)
Hemoglobin: 12 g/dL (ref 12.0–15.0)
Immature Granulocytes: 0 %
Lymphocytes Relative: 10 %
Lymphs Abs: 0.9 10*3/uL (ref 0.7–4.0)
MCH: 27.1 pg (ref 26.0–34.0)
MCHC: 33.6 g/dL (ref 30.0–36.0)
MCV: 80.6 fL (ref 80.0–100.0)
Monocytes Absolute: 0.7 10*3/uL (ref 0.1–1.0)
Monocytes Relative: 8 %
Neutro Abs: 7.2 10*3/uL (ref 1.7–7.7)
Neutrophils Relative %: 81 %
Platelets: 186 10*3/uL (ref 150–400)
RBC: 4.43 MIL/uL (ref 3.87–5.11)
RDW: 13 % (ref 11.5–15.5)
WBC: 8.9 10*3/uL (ref 4.0–10.5)
nRBC: 0 % (ref 0.0–0.2)

## 2022-12-09 LAB — HEPATIC FUNCTION PANEL
ALT: 11 U/L (ref 0–44)
AST: 15 U/L (ref 15–41)
Albumin: 3.7 g/dL (ref 3.5–5.0)
Alkaline Phosphatase: 76 U/L (ref 38–126)
Bilirubin, Direct: 0.1 mg/dL (ref 0.0–0.2)
Indirect Bilirubin: 1.1 mg/dL — ABNORMAL HIGH (ref 0.3–0.9)
Total Bilirubin: 1.2 mg/dL (ref 0.3–1.2)
Total Protein: 7.1 g/dL (ref 6.5–8.1)

## 2022-12-09 LAB — LIPASE, BLOOD: Lipase: 30 U/L (ref 11–51)

## 2022-12-09 MED ORDER — OXYCODONE HCL 5 MG PO TABS
5.0000 mg | ORAL_TABLET | Freq: Once | ORAL | Status: AC
  Administered 2022-12-09: 5 mg via ORAL
  Filled 2022-12-09: qty 1

## 2022-12-09 MED ORDER — IOHEXOL 300 MG/ML  SOLN
100.0000 mL | Freq: Once | INTRAMUSCULAR | Status: AC | PRN
  Administered 2022-12-09: 100 mL via INTRAVENOUS

## 2022-12-09 MED ORDER — SODIUM CHLORIDE 0.9 % IV BOLUS
1000.0000 mL | Freq: Once | INTRAVENOUS | Status: AC
  Administered 2022-12-09: 1000 mL via INTRAVENOUS

## 2022-12-09 MED ORDER — HYDROMORPHONE HCL 1 MG/ML IJ SOLN
1.0000 mg | Freq: Once | INTRAMUSCULAR | Status: AC
  Administered 2022-12-09: 1 mg via INTRAVENOUS
  Filled 2022-12-09: qty 1

## 2022-12-09 MED ORDER — ONDANSETRON HCL 4 MG/2ML IJ SOLN
4.0000 mg | Freq: Once | INTRAMUSCULAR | Status: AC
  Administered 2022-12-09: 4 mg via INTRAVENOUS
  Filled 2022-12-09: qty 2

## 2022-12-09 MED ORDER — MORPHINE SULFATE (PF) 4 MG/ML IV SOLN
4.0000 mg | Freq: Once | INTRAVENOUS | Status: AC
  Administered 2022-12-09: 4 mg via INTRAVENOUS
  Filled 2022-12-09: qty 1

## 2022-12-09 MED ORDER — OXYCODONE HCL 5 MG PO TABS: 5.0000 mg | ORAL_TABLET | Freq: Four times a day (QID) | ORAL | 0 refills | Status: DC | PRN

## 2022-12-09 NOTE — Discharge Instructions (Signed)
Please seek medical attention for any high fevers, chest pain, shortness of breath, change in behavior, persistent vomiting, bloody stool or any other new or concerning symptoms.  

## 2022-12-09 NOTE — ED Provider Notes (Signed)
[facility] Provider Note    Event Date/Time   First MD Initiated Contact with Patient 12/09/22 1957     (approximate)   History   Flank Pain   HPI  Cynthia Duncan is a 57 y.o. female who presents to the emergency department today because of concerns for continued left flank pain.  Patient states that she first started having pain a couple of days ago.  Has been fairly persistent but will wax and wane in intensity.  She has also noticed blood in her urine.  Went to primary care doctor's office today where they obtained a CT scan.  Was notable for hemorrhage in the left renal collecting system.  She was put on tramadol.  She states she has been taking the tramadol without any significant relief of the pain.  Pain has been accompanied by nausea.  Patient denies any fevers.  Denies similar pain in the past.     Physical Exam   Triage Vital Signs: ED Triage Vitals [12/09/22 1947]  Enc Vitals Group     BP (!) 146/81     Pulse Rate 84     Resp 18     Temp 98.2 F (36.8 C)     Temp Source Oral     SpO2 100 %     Weight 256 lb (116.1 kg)     Height  (1.753 m)     Head Circumference      Peak Flow      Pain Score 8     Pain Loc      Pain Edu?      Excl. in [place]?     Most recent vital signs: Vitals:   12/09/22 1947  BP: (!) 146/81  Pulse: 84  Resp: 18  Temp: 98.2 F (36.8 C)  SpO2: 100%   General: Awake, alert, oriented. CV:  Good peripheral perfusion. Regular rate and rhythm. Resp:  Normal effort. Lungs clear. Abd:  No distention. Tender to palpation on the left side.  ED Results / Procedures / Treatments   Labs (all labs ordered are listed, but only abnormal results are displayed) Labs Reviewed  CBC WITH DIFFERENTIAL/PLATELET - Abnormal; Notable for the following components:      Result Value   HCT 35.7 (*)    All other components within normal limits  BASIC METABOLIC PANEL - Abnormal; Notable for the following components:   Sodium  133 (*)    Creatinine, Ser 1.14 (*)    GFR, Estimated 57 (*)    All other components within normal limits  URINALYSIS, ROUTINE W REFLEX MICROSCOPIC - Abnormal; Notable for the following components:   Color, Urine AMBER (*)    APPearance CLOUDY (*)    Specific Gravity, Urine >1.046 (*)    Hgb urine dipstick LARGE (*)    Ketones, ur 20 (*)    Protein, ur 100 (*)    All other components within normal limits  HEPATIC FUNCTION PANEL - Abnormal; Notable for the following components:   Indirect Bilirubin 1.1 (*)    All other components within normal limits  LIPASE, BLOOD     EKG  None   RADIOLOGY Outside CT read reviewed.   PROCEDURES:  Critical Care performed: No   MEDICATIONS ORDERED IN ED: Medications - No data to display   IMPRESSION / MDM / ASSESSMENT AND PLAN / ED COURSE  I reviewed the triage vital signs and the nursing notes.  Differential diagnosis includes, but is not limited to, kidney stone, UTI, pyelonephritis  Patient's presentation is most consistent with acute presentation with potential threat to life or bodily function.   The patient is on the cardiac monitor to evaluate for evidence of arrhythmia and/or significant heart rate changes.  Patient presents to the emergency department today because of concern for left flank pain. Patient had seen PCP earlier in the day and had CT scan performed which was concerning for blood in the renal collecting system. Blood work without findings concerning for significant blood loss or renal dysfunction.  Discussed with Dr. Richardo Hanks with urology. Stated you can get pain when blood clots block the flow of urine, but they do typically dissolve. Discussed this with the patient. Pain was improved with medication here in the emergency department. Will plan on discharging with stronger narcotic pain medication. Will give urology follow up information.       FINAL CLINICAL IMPRESSION(S) / ED  DIAGNOSES   Final diagnoses:  Hematuria, unspecified type     Note:  This document was prepared using Dragon voice recognition software and may include unintentional dictation errors.    Phineas Semen, MD 12/09/22 [phone]

## 2022-12-09 NOTE — ED Triage Notes (Signed)
Pt to ED via EMS, pt c/o left flank pain. Pt was seen earlier at [facility] for same where pt had blood work urinalysis and ct scan done. Pt was prescribed abx and tramadol for UTI and pain. Pt states pain medicine is not helping. 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

## 2022-12-10 ENCOUNTER — Telehealth: Payer: Self-pay | Admitting: Urology

## 2022-12-10 NOTE — Telephone Encounter (Signed)
I called pt to set up NP appt in June.  Pt states she is still in a lot of pain and left kidney is bleeding.  She only has enough pain meds for 4 days.  She asked if she should go back to ER?

## 2022-12-12 ENCOUNTER — Encounter: Payer: Self-pay | Admitting: Emergency Medicine

## 2022-12-12 ENCOUNTER — Emergency Department: Payer: Federal, State, Local not specified - PPO

## 2022-12-12 ENCOUNTER — Other Ambulatory Visit: Payer: Self-pay

## 2022-12-12 ENCOUNTER — Observation Stay
Admission: EM | Admit: 2022-12-12 | Discharge: 2022-12-13 | Disposition: A | Discharge status: Home / Self Care | Payer: Federal, State, Local not specified - PPO | Attending: Internal Medicine | Admitting: Internal Medicine

## 2022-12-12 DIAGNOSIS — Z79899 Other long term (current) drug therapy: Secondary | ICD-10-CM | POA: Diagnosis not present

## 2022-12-12 DIAGNOSIS — E876 Hypokalemia: Secondary | ICD-10-CM | POA: Insufficient documentation

## 2022-12-12 DIAGNOSIS — N200 Calculus of kidney: Secondary | ICD-10-CM

## 2022-12-12 DIAGNOSIS — Z7982 Long term (current) use of aspirin: Secondary | ICD-10-CM | POA: Diagnosis not present

## 2022-12-12 DIAGNOSIS — R310 Gross hematuria: Principal | ICD-10-CM | POA: Diagnosis present

## 2022-12-12 DIAGNOSIS — R109 Unspecified abdominal pain: Secondary | ICD-10-CM | POA: Insufficient documentation

## 2022-12-12 DIAGNOSIS — E871 Hypo-osmolality and hyponatremia: Secondary | ICD-10-CM | POA: Diagnosis not present

## 2022-12-12 DIAGNOSIS — K219 Gastro-esophageal reflux disease without esophagitis: Secondary | ICD-10-CM | POA: Insufficient documentation

## 2022-12-12 DIAGNOSIS — D509 Iron deficiency anemia, unspecified: Secondary | ICD-10-CM | POA: Diagnosis not present

## 2022-12-12 DIAGNOSIS — N179 Acute kidney failure, unspecified: Secondary | ICD-10-CM | POA: Insufficient documentation

## 2022-12-12 DIAGNOSIS — I4892 Unspecified atrial flutter: Secondary | ICD-10-CM | POA: Diagnosis not present

## 2022-12-12 DIAGNOSIS — M545 Low back pain, unspecified: Secondary | ICD-10-CM | POA: Diagnosis present

## 2022-12-12 DIAGNOSIS — Z9889 Other specified postprocedural states: Secondary | ICD-10-CM

## 2022-12-12 DIAGNOSIS — I5032 Chronic diastolic (congestive) heart failure: Secondary | ICD-10-CM | POA: Diagnosis not present

## 2022-12-12 LAB — BASIC METABOLIC PANEL
Anion gap: 11 (ref 5–15)
BUN: 7 mg/dL (ref 6–20)
CO2: 22 mmol/L (ref 22–32)
Calcium: 8.9 mg/dL (ref 8.9–10.3)
Chloride: 102 mmol/L (ref 98–111)
Creatinine, Ser: 0.96 mg/dL (ref 0.44–1.00)
GFR, Estimated: >60 mL/min (ref >60)
Glucose, Bld: 91 mg/dL (ref 70–99)
Potassium: 3.4 mmol/L — ABNORMAL LOW (ref 3.5–5.1)
Sodium: 135 mmol/L (ref 135–145)

## 2022-12-12 LAB — URINALYSIS, ROUTINE W REFLEX MICROSCOPIC
Bacteria, UA: NONE SEEN
Bilirubin Urine: NEGATIVE
Glucose, UA: NEGATIVE mg/dL
Ketones, ur: 20 mg/dL — AB
Leukocytes,Ua: NEGATIVE
Nitrite: NEGATIVE
Protein, ur: 100 mg/dL — AB
RBC / HPF: >50 RBC/hpf (ref 0–5)
Specific Gravity, Urine: 1.031 — ABNORMAL HIGH (ref 1.005–1.030)
pH: 6 (ref 5.0–8.0)

## 2022-12-12 LAB — CBC
HCT: 35.7 % — ABNORMAL LOW (ref 36.0–46.0)
Hemoglobin: 11.8 g/dL — ABNORMAL LOW (ref 12.0–15.0)
MCH: 26.7 pg (ref 26.0–34.0)
MCHC: 33.1 g/dL (ref 30.0–36.0)
MCV: 80.8 fL (ref 80.0–100.0)
Platelets: 185 10*3/uL (ref 150–400)
RBC: 4.42 MIL/uL (ref 3.87–5.11)
RDW: 13 % (ref 11.5–15.5)
WBC: 8.6 10*3/uL (ref 4.0–10.5)
nRBC: 0 % (ref 0.0–0.2)

## 2022-12-12 LAB — POC URINE PREG, ED: Preg Test, Ur: NEGATIVE

## 2022-12-12 MED ORDER — HYDROMORPHONE HCL 1 MG/ML IJ SOLN: 0.5000 mg | Freq: Once | INTRAMUSCULAR | Status: DC

## 2022-12-12 MED ORDER — ONDANSETRON HCL 4 MG/2ML IJ SOLN
4.0000 mg | Freq: Once | INTRAMUSCULAR | Status: AC
  Administered 2022-12-12: 4 mg via INTRAVENOUS
  Filled 2022-12-12: qty 2

## 2022-12-12 MED ORDER — IOHEXOL 350 MG/ML SOLN
100.0000 mL | Freq: Once | INTRAVENOUS | Status: AC | PRN
  Administered 2022-12-12: 100 mL via INTRAVENOUS

## 2022-12-12 MED ORDER — HYDROMORPHONE HCL 1 MG/ML IJ SOLN
1.0000 mg | Freq: Once | INTRAMUSCULAR | Status: AC
  Administered 2022-12-12: 1 mg via INTRAVENOUS
  Filled 2022-12-12: qty 1

## 2022-12-12 MED ORDER — SODIUM CHLORIDE 0.9 % IV BOLUS
1000.0000 mL | Freq: Once | INTRAVENOUS | Status: AC
  Administered 2022-12-12: 1000 mL via INTRAVENOUS

## 2022-12-12 NOTE — ED Triage Notes (Signed)
Pt via [facility] from home. Pt c/o L sided back pain that radiates to LLQ pt also he been vomiting. Pt states that she has been having hematuria with blood clots. States she has been at [facility] and here for both. 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 Also has a hx of kidney stone but this pain feel different. Per pt, dissection has grown over the pass year. Pt has a hx of Marfan Syndrome. Pt took an oxycodone approx 1 hour ago. Pt is A&OX4 and NAD EMS report 130/82 BP, 70 HR, 100% on RA, 93 CBG

## 2022-12-12 NOTE — ED Provider Notes (Signed)
[facility] Provider Note    Event Date/Time   First MD Initiated Contact with Patient 12/12/22 1923     (approximate)   History   Back Pain   HPI  Cynthia Duncan is a 57 y.o. female with past medical history of Marfan syndrome, status post ascending and lower abdominal aortic repair, here with left flank pain.  The patient has had an extensive history of left flank pain over the last week and a half.  She has been seen here as well as at [facility] for this with scans which shows slight extension/enlargement of her aortic dissection, but also possible small stone on the left.  She has been taking oxycodone up to 10 mg every 4-6 hours without significant relief.  The pain is 10 out of 10, severe, and she had vomiting and inability to keep anything down.  She said chills but no known fevers.  She had nausea and vomiting.     Physical Exam   Triage Vital Signs: ED Triage Vitals  Enc Vitals Group     BP 12/12/22 1841 (!) 147/64     Pulse Rate 12/12/22 1841 74     Resp 12/12/22 1841 20     Temp 12/12/22 1841 98.6 F (37 C)     Temp Source 12/12/22 1841 Oral     SpO2 12/12/22 1841 95 %     Weight 12/12/22 1840 256 lb (116.1 kg)     Height 12/12/22 1840  (1.753 m)     Head Circumference --      Peak Flow --      Pain Score 12/12/22 1836 5     Pain Loc --      Pain Edu? --      Excl. in [place]? --     Most recent vital signs: Vitals:   12/12/22 1930 12/12/22 2304  BP: (!) 140/73   Pulse: 70   Resp:    Temp:  98.1 F (36.7 C)  SpO2: 100%      General: Awake, no distress.  CV:  Good peripheral perfusion.  Regular rate and rhythm. Resp:  Normal work of breathing.  Abd:  No distention.  Moderate suprapubic and left flank tenderness.  No rebound or guarding. Other:  No lower extremity edema.   ED Results / Procedures / Treatments   Labs (all labs ordered are listed, but only abnormal results are displayed) Labs Reviewed  URINALYSIS, ROUTINE W  REFLEX MICROSCOPIC - Abnormal; Notable for the following components:      Result Value   Color, Urine AMBER (*)    APPearance CLOUDY (*)    Specific Gravity, Urine 1.031 (*)    Hgb urine dipstick LARGE (*)    Ketones, ur 20 (*)    Protein, ur 100 (*)    All other components within normal limits  BASIC METABOLIC PANEL - Abnormal; Notable for the following components:   Potassium 3.4 (*)    All other components within normal limits  CBC - Abnormal; Notable for the following components:   Hemoglobin 11.8 (*)    HCT 35.7 (*)    All other components within normal limits  POC URINE PREG, ED - Normal     EKG Normal sinus rhythm, VR  73. PR 169, QRS 105, QTc 522. No acute ST elevations or depressions.   RADIOLOGY CT C/A/P: chronic dissection, not significantly worsened when compared to recent CTs, no hydro, no stone   I also independently reviewed  and agree with radiologist interpretations.   PROCEDURES:  Critical Care performed: No   MEDICATIONS ORDERED IN ED: Medications  iohexol (OMNIPAQUE) 350 MG/ML injection 100 mL (100 mLs Intravenous Contrast Given 12/12/22 2015)  ondansetron (ZOFRAN) injection 4 mg (4 mg Intravenous Given 12/12/22 1959)  HYDROmorphone (DILAUDID) injection 1 mg (1 mg Intravenous Given 12/12/22 1959)  sodium chloride 0.9 % bolus 1,000 mL (1,000 mLs Intravenous New Bag/Given 12/12/22 2214)     IMPRESSION / MDM / ASSESSMENT AND PLAN / ED COURSE  I reviewed the triage vital signs and the nursing notes.                              Differential diagnosis includes, but is not limited to, nephrolithiasis, recurrent gross hematuria w/ intermittent obstruction clots, worsening dissection, aneurysm, MSK flank pain  Patient's presentation is most consistent with acute presentation with potential threat to life or bodily function.  The patient is on the cardiac monitor to evaluate for evidence of arrhythmia and/or significant heart rate changes   57 yo F with  PMHx Marfan's s/p aortic and abdominal aortic dissection, recurrent hematuria, here with ongoing severe L flank pain. Pt has had severe pain despite outpt oxycodone, and has been seen multip[le times for this. Suspect recurrent renal hemorrhage with intermittent passage of clots, which I suspect are causing transient hydro/pain. She appears very uncomfortable and ua shows persistent hematuria, mild pyuria as well as dehydration. CT imaging obtained, reviewed, shows no apparent worsening of her dissection when compared to recent reads at [facility]. Discussed with Dr. Apolinar Junes who recommends supportive care, no intervention indicated at this time. Will admit for analgesia.   FINAL CLINICAL IMPRESSION(S) / ED DIAGNOSES   Final diagnoses:  Gross hematuria  Left flank pain     Rx / DC Orders   ED Discharge Orders     None        Note:  This document was prepared using Dragon voice recognition software and may include unintentional dictation errors.   Shaune Pollack, MD 12/13/22 [phone]

## 2022-12-12 NOTE — ED Notes (Signed)
Patient transported to CT 

## 2022-12-12 NOTE — H&P (Signed)
History and Physical    PatientAmyria Duncan ZOX:[id number] DOB: Dec 22, 1965 DOA: 12/12/2022 DOS: the patient was seen and examined on 12/13/2022 PCP: Enid Baas, MD  Patient coming from: Home  Chief Complaint:  Chief Complaint  Patient presents with   Back Pain   HPI: Cynthia Duncan is a 57 y.o. female with medical history significant of  Aortic dissection s/p aortic root repair at [facility] 2019, recurrent kidney stones, Marfan syndrome who presented for waxing and waning left flank pain that radiates to lower abdomen down to her urethra. Pt having dysuria with blood clots that started over a week ago.  Reports her urine is like "cranberry juice".  At times she feels like she is unable to urinate.  She noticed that her abdomen feels swollen during these times.  Pain causes her to have some nausea and vomiting.  She tried tramadol which did not help so she then started taking oxycodone and that stopped working therefore she presented to the emergency department.  Denies fever, chills, headache, new back pain, pain between her shoulder blades, numbness or tingling in her arms or legs.  Notes he did have bilateral thigh pain a couple days ago which resolved.  Upon arrival to the ED, vitals revealed a slightly hypertensive 147/64 and satting 95% on room air otherwise normal.  ED provider notes she was uncomfortable appearing. She was given Dilaudid 1 mg, Zofran 4 mg, 1 L normal saline bolus.  Labs and imaging reviewed reveals BMP with potassium 3.4 otherwise normal; CBC normocytic anemia hemoglobin 11.8 otherwise unremarkable, negative urine pregnancy test, urinalysis with hematuria without concerning signs for acute cystitis.  CT angio chest abdomen pelvis with aortic dissection extending from proximal to the ascending aorta to the level of the renal arteries and has been increased from August 2017 from 2.9 cm to now 4.3 cm, partial occlusion of aortobiiliac bypass graft with retrograde filling. ED  provider spoke with Dr Vanna Scotland, urologist who recommended no acute intervention at this time.     Review of Systems: As mentioned in the history of present illness. All other systems reviewed and are negative. Past Medical History:  Diagnosis Date   Anemia    Aortic aneurysm    "The starting of one in 2012"- found by using dye- 2015- no evidence of aneurysm   Cyst of breast 2016   left   Kidney stones    Marfan syndrome    Ulcerative colitis    Vitamin D deficiency    Past Surgical History:  Procedure Laterality Date   ABDOMINAL AORTIC ANEURYSM REPAIR     COLONOSCOPY  2012   had in [place].- Dx- Ulcerative colitis   HERNIA REPAIR     Social History:  reports that she has never smoked. She has never used smokeless tobacco. She reports that she does not drink alcohol and does not use drugs.  Allergies  Allergen Reactions   Erythromycin Diarrhea and Other (See Comments)    Severe abdominal pain.   Other Diarrhea, Nausea Only and Nausea And Vomiting   Penicillins Swelling    Has patient had a PCN reaction causing immediate rash, facial/tongue/throat swelling, SOB or lightheadedness with hypotension: Yes Has patient had a PCN reaction causing severe rash involving mucus membranes or skin necrosis: No Has patient had a PCN reaction that required hospitalization No Has patient had a PCN reaction occurring within the last 10 years: No If all of the above answers are "NO", then may proceed with Cephalosporin use.  Quinolones Other (See Comments)    Fluroquinolone antibiotics should be avoided in patients with aortic disease unless alternative therapy is not an option.    Family History  Problem Relation Age of Onset   Heart disease Father    Tuberculosis Father    Bladder Cancer Neg Hx    Kidney cancer Neg Hx     Prior to Admission medications   Medication Sig Start Date End Date Taking? Authorizing Provider  aspirin EC 81 MG tablet Take 81 mg by mouth every evening.     [provider]  atorvastatin (LIPITOR) 20 MG tablet Take 20 mg by mouth daily.    [provider]  carvedilol (COREG) 6.25 MG tablet Take 6.25 mg by mouth 2 (two) times daily. 03/17/16   [provider]  Cholecalciferol (VITAMIN D-3) 1000 UNITS CAPS Take 1,000 Units by mouth every morning.     [provider]  cyclobenzaprine (FLEXERIL) 10 MG tablet Take 1 tablet (10 mg total) by mouth 3 (three) times daily as needed for muscle spasms. Patient not taking: Reported on 05/11/2017 10/24/16   Governor Rooks, MD  ferrous fumarate (HEMOCYTE - 106 MG FE) 325 (106 FE) MG TABS tablet Take 1 tablet by mouth 3 (three) times daily.    [provider]  fluconazole (DIFLUCAN) 200 MG tablet Take 1 tablet (200 mg total) by mouth once. Patient not taking: Reported on 05/11/2017 03/20/15   Isa Rankin, MD  fluticasone [facility]) 50 MCG/ACT nasal spray Place 2 sprays into the nose 2 (two) times daily as needed.    [provider]  ibuprofen (ADVIL,MOTRIN) 600 MG tablet Take 1 tablet (600 mg total) by mouth every 8 (eight) hours as needed. 10/24/16   Governor Rooks, MD  Multiple Vitamin (MULTI-VITAMINS) TABS Take 1 tablet by mouth every morning.    [provider]  nitrofurantoin (MACRODANTIN) 100 MG capsule Take 100 mg by mouth 2 (two) times daily.    [provider]  omeprazole (PRILOSEC) 40 MG capsule Take 1 capsule (40 mg total) by mouth 2 (two) times daily before a meal. Patient not taking: Reported on 05/11/2017 03/20/15   Isa Rankin, MD  oxyCODONE (ROXICODONE) 5 MG immediate release tablet Take 1 tablet (5 mg total) by mouth every 6 (six) hours as needed for severe pain. 12/09/22   Phineas Semen, MD  oxyCODONE-acetaminophen (ROXICET) 5-325 MG tablet Take 1 tablet by mouth every 6 (six) hours as needed. 05/10/17   Rebecka Apley, MD    Physical Exam: Vitals:   12/12/22 1840 12/12/22 1841 12/12/22 1930 12/12/22 2304  BP:  (!)  147/64 (!) 140/73   Pulse:  74 70   Resp:  20    Temp:  98.6 F (37 C)  98.1 F (36.7 C)  TempSrc:  Oral  Oral  SpO2:  95% 100%   Weight: 116.1 kg     Height: 5\' 9"  (1.753 m)      GEN:     alert, female, in no acute distress   HENT:  mucus membranes moist, oropharyngeal without lesions or erythema,  nares patent, no nasal discharge  EYES:   pupils equal and reactive, EOM intact NECK:  supple, normal ROM RESP:  clear to auscultation bilaterally, no increased work of breathing  CVS:   regular rate and rhythm, no JVP, equal and strong radial and DP pulses  ABD:  soft, mild suprapubic tenderness and left CVA tenderness; bowel sounds present EXT:   normal ROM, atraumatic,  no edema, strength 5/5 bilateral upper and lower extremities  NEURO: speech normal, alert and oriented, normal coordination  Skin:   warm and dry Psych: Normal affect, appropriate speech and behavior   Data Reviewed: Relevant notes from primary care and specialist visits, past discharge summaries as available in EHR, including Care Everywhere. Prior diagnostic testing as pertinent to current admission diagnoses Updated medications and problem lists for reconciliation ED course, including vitals, labs, imaging, treatment and response to treatment Triage notes, nursing and pharmacy notes and ED provider's notes Notable results as noted in HPI  Assessment and Plan: Principal Problem:   Gross hematuria   Gross hematuria with left flank pain  Urinalysis confirming acute hematuria.  Hemoglobin 11.8.  She has had some dysuria which is suspected to be due to the frequent blood clots versus recent passing of kidney stone.  Denies any urinary frequency or urgency.  She was taking nitrofurantoin outpatient but her admission urinalysis did not show any acute cystitis.  Patient reports she has an upcoming appointment with a urologist on Tuesday.  - Observe on telemetry  - ED provider spoke with urology, Dr Leonides Sake.  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  CT angio chest abdomen pelvis showing interval increase in aortic dissection that extends from the proximal descending aorta to the level of the renal arteries.  In August 2017 it was measured as 2.9 cm and today 4.3 cm.  CTA Chest measures  descending thoracic aorta near the level of the carina measures 4.9 x 4.7  cm this appears to be an change. These are conflicting measurements. - Cardiac telemetry  - Consider reaching out to pt's thoracic surgeon to review images vs obtaining cardiac MRA   GERD  - continue home omeprazole   IDA  She has required blood transfusions in the past. Hemoglobin 11.8 with MCV 80.7.  - maintain hemoglobin > 8 - trend hgb with CBC   Paroxysmal atrial flutter  Chronic diastolic heart failure LV EF 69% without regional wall motion abnormalities.  RV function normal.  Appears euvolemic on exam. Reports her urologist told her to stop Losartan.  -Continue Coreg 3.125 mg twice daily. - Continue Lipitor 40 mg daily  - Hold ASA in setting of gross hematuria    Advance Care Planning:   Code Status: Full Code   Consults: Urology by ED  Family Communication: husband at bedside   Severity of Illness: The appropriate patient status for this patient is OBSERVATION. Observation status is judged to be reasonable and necessary in order to provide the required intensity of service to ensure the patient's safety. The patient's presenting symptoms, physical exam findings, and initial radiographic and laboratory data in the context of their medical condition is felt to place them at decreased risk for further clinical deterioration. Furthermore, it is anticipated that the patient will be medically stable for  discharge from the hospital within 2 midnights of admission.   Author: Katha Cabal, DO 12/13/2022 1:10 AM  For on call review [email or web address].

## 2022-12-13 ENCOUNTER — Encounter: Payer: Self-pay | Admitting: Family Medicine

## 2022-12-13 DIAGNOSIS — Z9889 Other specified postprocedural states: Secondary | ICD-10-CM

## 2022-12-13 DIAGNOSIS — R310 Gross hematuria: Principal | ICD-10-CM

## 2022-12-13 DIAGNOSIS — E876 Hypokalemia: Secondary | ICD-10-CM | POA: Insufficient documentation

## 2022-12-13 DIAGNOSIS — I5032 Chronic diastolic (congestive) heart failure: Secondary | ICD-10-CM | POA: Diagnosis not present

## 2022-12-13 DIAGNOSIS — I4892 Unspecified atrial flutter: Secondary | ICD-10-CM | POA: Insufficient documentation

## 2022-12-13 DIAGNOSIS — K219 Gastro-esophageal reflux disease without esophagitis: Secondary | ICD-10-CM | POA: Insufficient documentation

## 2022-12-13 LAB — COMPREHENSIVE METABOLIC PANEL
ALT: 9 U/L (ref 0–44)
AST: 13 U/L — ABNORMAL LOW (ref 15–41)
Albumin: 3.2 g/dL — ABNORMAL LOW (ref 3.5–5.0)
Alkaline Phosphatase: 67 U/L (ref 38–126)
Anion gap: 11 (ref 5–15)
BUN: 5 mg/dL — ABNORMAL LOW (ref 6–20)
CO2: 25 mmol/L (ref 22–32)
Calcium: 8.9 mg/dL (ref 8.9–10.3)
Chloride: 103 mmol/L (ref 98–111)
Creatinine, Ser: 0.77 mg/dL (ref 0.44–1.00)
GFR, Estimated: >60 mL/min (ref >60)
Glucose, Bld: 81 mg/dL (ref 70–99)
Potassium: 3.6 mmol/L (ref 3.5–5.1)
Sodium: 139 mmol/L (ref 135–145)
Total Bilirubin: 0.8 mg/dL (ref 0.3–1.2)
Total Protein: 7.2 g/dL (ref 6.5–8.1)

## 2022-12-13 LAB — CBC
HCT: 36.5 % (ref 36.0–46.0)
Hemoglobin: 11.8 g/dL — ABNORMAL LOW (ref 12.0–15.0)
MCH: 26.3 pg (ref 26.0–34.0)
MCHC: 32.3 g/dL (ref 30.0–36.0)
MCV: 81.3 fL (ref 80.0–100.0)
Platelets: 207 10*3/uL (ref 150–400)
RBC: 4.49 MIL/uL (ref 3.87–5.11)
RDW: 13.2 % (ref 11.5–15.5)
WBC: 7 10*3/uL (ref 4.0–10.5)
nRBC: 0 % (ref 0.0–0.2)

## 2022-12-13 LAB — MAGNESIUM: Magnesium: 1.7 mg/dL (ref 1.7–2.4)

## 2022-12-13 LAB — PROTIME-INR
INR: 1.2 (ref 0.8–1.2)
Prothrombin Time: 15.1 seconds (ref 11.4–15.2)

## 2022-12-13 LAB — HIV ANTIBODY (ROUTINE TESTING W REFLEX): HIV Screen 4th Generation wRfx: NONREACTIVE

## 2022-12-13 MED ORDER — ONDANSETRON HCL 4 MG/2ML IJ SOLN
4.0000 mg | Freq: Four times a day (QID) | INTRAMUSCULAR | Status: DC | PRN
  Administered 2022-12-13: 4 mg via INTRAVENOUS
  Filled 2022-12-13: qty 2

## 2022-12-13 MED ORDER — ATORVASTATIN CALCIUM 20 MG PO TABS
40.0000 mg | ORAL_TABLET | Freq: Every day | ORAL | Status: DC
  Administered 2022-12-13: 40 mg via ORAL
  Filled 2022-12-13: qty 2

## 2022-12-13 MED ORDER — ACETAMINOPHEN 325 MG PO TABS
650.0000 mg | ORAL_TABLET | Freq: Four times a day (QID) | ORAL | Status: DC
  Administered 2022-12-13 (×2): 650 mg via ORAL
  Filled 2022-12-13 (×2): qty 2

## 2022-12-13 MED ORDER — OXYCODONE-ACETAMINOPHEN 5-325 MG PO TABS
1.0000 | ORAL_TABLET | ORAL | Status: DC | PRN
  Administered 2022-12-13: 2 via ORAL
  Filled 2022-12-13: qty 2

## 2022-12-13 MED ORDER — FERROUS SULFATE 325 (65 FE) MG PO TABS
325.0000 mg | ORAL_TABLET | Freq: Every day | ORAL | Status: DC
  Administered 2022-12-13: 325 mg via ORAL
  Filled 2022-12-13: qty 1

## 2022-12-13 MED ORDER — NITROFURANTOIN MONOHYD MACRO 100 MG PO CAPS
100.0000 mg | ORAL_CAPSULE | Freq: Two times a day (BID) | ORAL | Status: DC
  Administered 2022-12-13: 100 mg via ORAL
  Filled 2022-12-13: qty 1

## 2022-12-13 MED ORDER — LOSARTAN POTASSIUM 25 MG PO TABS
25.0000 mg | ORAL_TABLET | Freq: Every day | ORAL | Status: DC
  Administered 2022-12-13: 25 mg via ORAL
  Filled 2022-12-13: qty 1

## 2022-12-13 MED ORDER — HYDROMORPHONE HCL 1 MG/ML IJ SOLN
1.0000 mg | INTRAMUSCULAR | Status: DC | PRN
  Administered 2022-12-13: 1 mg via INTRAVENOUS
  Filled 2022-12-13 (×2): qty 1

## 2022-12-13 MED ORDER — POLYETHYLENE GLYCOL 3350 17 G PO PACK: 17.0000 g | PACK | Freq: Every day | ORAL | Status: DC | PRN

## 2022-12-13 MED ORDER — SENNA 8.6 MG PO TABS
1.0000 | ORAL_TABLET | Freq: Two times a day (BID) | ORAL | Status: DC
  Administered 2022-12-13 (×2): 8.6 mg via ORAL
  Filled 2022-12-13 (×2): qty 1

## 2022-12-13 MED ORDER — CARVEDILOL 3.125 MG PO TABS
3.1250 mg | ORAL_TABLET | Freq: Two times a day (BID) | ORAL | Status: DC
  Administered 2022-12-13: 3.125 mg via ORAL
  Filled 2022-12-13: qty 1

## 2022-12-13 NOTE — ED Notes (Addendum)
After advising pt that the room she was assigned to, pt opted out of that assignment due to having to share a room. Charge nurse notified, pt updated that she does not have a choice on assignment and accomodation to address pt concerns addressed and agreed  upon.

## 2022-12-13 NOTE — Discharge Summary (Signed)
Physician Discharge Summary   Patient: Cynthia Duncan MRN: [id number] DOB: 02/05/1966  Admit date:     12/12/2022  Discharge date: 12/13/22  Discharge Physician: Marrion Coy   PCP: Enid Baas, MD   Recommendations at discharge:   Follow-up with the CT surgery, Dr. Zebedee Iba will call you for appointment. Follow-up with urology on Tuesday as scheduled. Follow-up with PCP as outpatient.  Discharge Diagnoses: Principal Problem:   Gross hematuria Active Problems:   Kidney stones   Iron deficiency anemia   GERD (gastroesophageal reflux disease)   Chronic diastolic CHF (congestive heart failure)   Paroxysmal atrial flutter   Status post aortic dissection repair   Hypokalemia  Resolved Problems:   * No resolved hospital problems. * Hypokalemia Hyponatremia. Acute kidney injury  Hospital Course:  Cynthia Duncan is a 57 y.o. female with medical history significant of  Aortic dissection s/p aortic root repair at [facility] 2019, recurrent kidney stones, Marfan syndrome who presented for waxing and waning left flank pain that radiates to lower abdomen down to her urethra. Pt having dysuria with blood clots that started over a week ago.  Patient was admitted to the hospital for observation for gross hematuria.  Assessment and Plan: Gross hematuria  History of kidney stones Aortic dissection of ascending and descending aorta. Patient had recurrent hematuria, it happens every year with 2 weeks of hematuria.  Patient has been followed by Dr. Zebedee Iba from [facility].  Discussed with Dr. Zebedee Iba, he has compared our CT scan image sent to him through cloud to his on record, does not believe this is worsened.  However, patient does not need surgery in the near future.  He will set up appointment in 2 to 3 days to arrange for future surgery.  But the gross hematuria appears to be secondary to kidney stone.  Patient had a CT scan on 4/18, showed kidney stone in the left, repeated CT scan the stone has  resolved.  Looks like the patient passed a stone during the last few days, which could be the source of flank pain and gross hematuria. Patient hemoglobin has been stable, flank pain is better.  Renal function also normalized after arriving hospital.  She is medically stable to be discharged.  Paroxysmal atrial flutter. Chronic diastolic congestive heart failure. Patient is not on anticoagulation, aspirin can be restarted as hematuria is improving. Patient is euvolemic, no exacerbation.      Consultants: None Procedures performed: None  Disposition: Home Diet recommendation:  Discharge Diet Orders (From admission, onward)     Start     Ordered   12/13/22 0000  Diet - low sodium heart healthy        12/13/22 1413           Cardiac diet DISCHARGE MEDICATION: Allergies as of 12/13/2022       Reactions   Erythromycin Diarrhea, Other (See Comments)   Severe abdominal pain.   Other Diarrhea, Nausea Only, Nausea And Vomiting   Penicillins Swelling   Has patient had a PCN reaction causing immediate rash, facial/tongue/throat swelling, SOB or lightheadedness with hypotension: Yes Has patient had a PCN reaction causing severe rash involving mucus membranes or skin necrosis: No Has patient had a PCN reaction that required hospitalization No Has patient had a PCN reaction occurring within the last 10 years: No If all of the above answers are "NO", then may proceed with Cephalosporin use.   Quinolones Other (See Comments)   Fluroquinolone antibiotics should be avoided in patients with  aortic disease unless alternative therapy is not an option.        Medication List     STOP taking these medications    cyclobenzaprine 10 MG tablet Commonly known as: FLEXERIL   fluconazole 200 MG tablet Commonly known as: DIFLUCAN   ibuprofen 600 MG tablet Commonly known as: ADVIL   omeprazole 40 MG capsule Commonly known as: PRILOSEC   oxyCODONE 5 MG immediate release tablet Commonly  known as: Roxicodone       TAKE these medications    aspirin EC 81 MG tablet Take 81 mg by mouth every evening.   atorvastatin 40 MG tablet Commonly known as: LIPITOR Take 40 mg by mouth daily.   carvedilol 3.125 MG tablet Commonly known as: COREG Take 3.125 mg by mouth 2 (two) times daily.   ferrous sulfate 325 (65 FE) MG EC tablet Take 325 mg by mouth daily with breakfast.   fluticasone 50 MCG/ACT nasal spray Commonly known as: FLONASE Place 2 sprays into the nose 2 (two) times daily as needed.   losartan 25 MG tablet Commonly known as: COZAAR Take 1 tablet by mouth daily.   Multi-Vitamins Tabs Take 1 tablet by mouth every morning.   nitrofurantoin 100 MG capsule Commonly known as: MACRODANTIN Take 100 mg by mouth 2 (two) times daily.   oxyCODONE-acetaminophen 5-325 MG tablet Commonly known as: Roxicet Take 1 tablet by mouth every 6 (six) hours as needed.   traMADol 50 MG tablet Commonly known as: ULTRAM Take 50 mg by mouth every 8 (eight) hours as needed.   Vitamin D-3 25 MCG (1000 UT) Caps Take 1,000 Units by mouth every morning.        Follow-up Information     Enid Baas, MD Follow up in 1 week(s).   Specialty: Internal Medicine Contact information: [address] [place] [address] [phone]                Discharge Exam: Ceasar Mons Weights   12/12/22 1840  Weight: 116.1 kg   General exam: Appears calm and comfortable  Respiratory system: Clear to auscultation. Respiratory effort normal. Cardiovascular system: S1 & S2 heard, RRR. No JVD, murmurs, rubs, gallops or clicks. No pedal edema. Gastrointestinal system: Abdomen is nondistended, soft and nontender. No organomegaly or masses felt. Normal bowel sounds heard. Central nervous system: Alert and oriented. No focal neurological deficits. Extremities: Symmetric 5 x 5 power. Skin: No rashes, lesions or ulcers Psychiatry: Judgement and insight appear normal. Mood &  affect appropriate.    Condition at discharge: good  The results of significant diagnostics from this hospitalization (including imaging, microbiology, ancillary and laboratory) are listed below for reference.   Imaging Studies: CT Angio Chest/Abd/Pel for Dissection W and/or Wo Contrast  Result Date: 12/12/2022 CLINICAL DATA:  Left-sided back pain radiating to the left lower quadrant. EXAM: CT ANGIOGRAPHY CHEST, ABDOMEN AND PELVIS TECHNIQUE: Non-contrast CT of the chest was initially obtained. Multidetector CT imaging through the chest, abdomen and pelvis was performed using the standard protocol during bolus administration of intravenous contrast. Multiplanar reconstructed images and MIPs were obtained and reviewed to evaluate the vascular anatomy. RADIATION DOSE REDUCTION: This exam was performed according to the departmental dose-optimization program which includes automated exposure control, adjustment of the mA and/or kV according to patient size and/or use of iterative reconstruction technique. CONTRAST:  OMNIPAQUE IOHEXOL 350 MG/ML SOLN COMPARISON:  12/09/2022 CT abdomen pelvis 03/27/2016 CTA chest abdomen pelvis FINDINGS: CTA CHEST FINDINGS Cardiovascular: --Heart: The heart size  is normal.  There is nopericardial effusion. --Aorta: There is an aortic dissection that extends in the proximal descending aorta to the level of the renal arteries. The caliber of the descending thoracic aorta has increased since 03/27/2016 and measures 3.7 cm transverse at the level of the diaphragmatic hiatus, previously 2.7 cm at the level of the right pulmonary artery, it measures 4.3 cm transverse, previously 2.9 cm. --Pulmonary Arteries: Contrast timing is optimized for preferential opacification of the aorta. Within that limitation, normal central pulmonary arteries. Mediastinum/Nodes: No mediastinal, hilar or axillary lymphadenopathy. The visualized thyroid and thoracic esophageal course are unremarkable.  Lungs/Pleura: No pulmonary nodules or masses. No pleural effusion or pneumothorax. No focal airspace consolidation. No focal pleural abnormality. Musculoskeletal: No chest wall abnormality. No acute osseous findings. Review of the MIP images confirms the above findings. CTA ABDOMEN AND PELVIS FINDINGS VASCULAR Aorta: Dissection extends to the level of the renal arteries. There is aortic atherosclerosis. Celiac: Arises from the true lumen and is widely patent. SMA: Arises from the true lumen and is widely patent. Renals: Single renal arteries bilaterally. Right renal artery arises from the false lumen. Left renal artery arises from the true lumen. IMA: Patent without abnormality. Inflow: Aorto bi-iliac bypass graft with partial occlusion of the native common iliac arteries and retrograde filling. Veins: Normal course and caliber of the major veins. Assessment is otherwise limited by the arterial dominant contrast phase. Review of the MIP images confirms the above findings. NON-VASCULAR Hepatobiliary: Normal hepatic contours and density. No visible biliary dilatation. Normal gallbladder. Pancreas: Normal contours without ductal dilatation. No peripancreatic fluid collection. Spleen: Normal arterial phase splenic enhancement pattern. Adrenals/Urinary Tract: --Adrenal glands: Normal. --Right kidney/ureter: No hydronephrosis or perinephric stranding. No nephrolithiasis. No obstructing ureteral stones. --Left kidney/ureter: No hydronephrosis or perinephric stranding. No nephrolithiasis. No obstructing ureteral stones. --Urinary bladder: Unremarkable. Stomach/Bowel: --Stomach/Duodenum: Unchanged mucosal thickening at the pylorus, but the stomach is less distended. --Small bowel: No dilatation or inflammation. --Colon: No focal abnormality. --Appendix: Normal. Lymphatic:  No abdominal or pelvic lymphadenopathy. Reproductive: There are multiple uterine fibroids. IUD in the uterus. Musculoskeletal. No bony spinal canal  stenosis or focal osseous abnormality. Other: None. Review of the MIP images confirms the above findings. Review of the MIP images confirms the above findings. IMPRESSION: 1. Aortic dissection that extends from the proximal descending aorta to the level of the renal arteries. The caliber of the descending thoracic aorta has increased since 03/27/2016 and measures 4.3 cm transverse, previously 2.9 cm. 2. Aorto bi-iliac bypass graft with partial occlusion of the native common iliac arteries and retrograde filling. 3. Unchanged appearance of mucosal thickening at the pylorus. Correlation with endoscopy suggested. Aortic Atherosclerosis (ICD10-I70.0). Electronically Signed   By: Deatra Robinson M.D.   On: 12/12/2022 20:34   CT ABDOMEN PELVIS W WO CONTRAST  Result Date: 12/09/2022 CLINICAL DATA:  Hematuria.  Kidney stones. EXAM: CT ABDOMEN AND PELVIS WITHOUT AND WITH CONTRAST TECHNIQUE: Multidetector CT imaging of the abdomen and pelvis was performed following the standard protocol before and following the bolus administration of intravenous contrast. RADIATION DOSE REDUCTION: This exam was performed according to the departmental dose-optimization program which includes automated exposure control, adjustment of the mA and/or kV according to patient size and/or use of iterative reconstruction technique. CONTRAST:  OMNIPAQUE IOHEXOL 300 MG/ML  SOLN COMPARISON:  Pelvic ultrasound 06/17/2021.  CT 05/09/2017 and older FINDINGS: Lower chest: Status post median sternotomy. Lung bases are grossly clear. No pleural effusion. Some breathing motion. Hepatobiliary: Gallbladder is  nondilated. Patent portal vein. Tiny cystic foci in the liver are again noted and too small to completely characterize but likely benign cysts. No specific imaging follow-up. Pancreas: Unremarkable. No pancreatic ductal dilatation or surrounding inflammatory changes. Spleen: Normal in size without focal abnormality. Adrenals/Urinary Tract: The  adrenal glands are preserved Mild atrophy of the right kidney. There are 2 punctate stones in the right kidney measuring 3 mm. One each pole. Enhancing right-sided renal mass or collecting system dilatation. Calices are delicately cupped. No collecting system dilatation or filling defect. No urothelial thickening. The ureter has a normal course and caliber down to the bladder. No abnormal calcifications seen along the left kidney nor along the course of the left ureter. However there is some increased density along the course of the left renal collecting system on the noncontrast dataset for example coronal series 5, image 50. This is predominantly in the lower pole, renal pelvis and proximal ureter. Possibilities include hemorrhage. On delay there is a filling defect in the area of this increased noncontrast density. Slight ectasia of the collecting system diffusely as well. An underlying mass lesion is in the differential along with hematoma and recommend further evaluation Preserved contours of the urinary bladder. Stomach/Bowel: Small hiatal hernia. The stomach is distended with fluid. There is some thickening in the area of the pylorus some nodularity. Please correlate with any symptoms and recommend further evaluation when appropriate to exclude underlying lesion with associated distention of the stomach. Please see series 7, image 25. Scattered colonic stool. Small bowel is nondilated. Vascular/Lymphatic: Preserved IVC. No specific abnormal lymph node enlargement identified in the abdomen and pelvis. Once again there is a known dissection of the aorta extending from the descending thoracic aorta down to the abdomen. The extent and distribution appears similar to previous. Please correlate with dedicated history and workup. Note is also made of surgical changes with an aorto bi iliac bypass graft. The native common iliac vessels are partially occluded with retrograde filling. Reproductive: Lobular uterus with  multiple fibroids. IUD in place. No separate adnexal mass. Other: Mesh along the anterior abdominal wall for presumed remote hernia repair. No free air or free fluid. Musculoskeletal: Scattered degenerative changes of the spine and pelvis. IMPRESSION: Mildly dilated renal collecting system with high density filling defects along the lower pole left kidney, renal pelvis and into the proximal ureter. Based on appearance this could be hemorrhage. However this does has a persistent filling defect along the delayed excretory phases. Underlying intrinsic urothelial neoplasm is not excluded. Recommend further urologic evaluation. Otherwise no enhancing renal mass. Right-sided nonobstructing renal stones. Known aortic dissection, incompletely evaluated on this examination but what is seen is similar to the prior exam. Dedicated evaluation as clinically appropriate and correlate with history. Associated surgical changes from aorto bi iliac bypass graft. Distended stomach with fluid with masslike mucosal thickening in the area of the pylorus. Would recommend further workup to assess for an intrinsic mass lesion or potential neoplasm. IUD in the uterus with multiple fibroids. Findings will be called to the ordering service by the Radiology physician assistant team Electronically Signed   By: Karen Kays M.D.   On: 12/09/2022 17:15    Microbiology: Results for orders placed or performed in visit on 05/11/17  Microscopic Examination     Status: Abnormal   Collection Time: 05/11/17  3:31 PM   URINE  Result Value Ref Range Status   WBC, UA 6-10 (A) 0 - 5 /hpf Final   RBC, UA >30 (  H) 0 - 2 /hpf Final   Epithelial Cells (non renal) None seen 0 - 10 /hpf Final   Mucus, UA Present (A) Not Estab. Final   Bacteria, UA Few (A) None seen/Few Final    Labs: CBC: Recent Labs  Lab 12/09/22 2036 12/12/22 1849 12/13/22 0816  WBC 8.9 8.6 7.0  NEUTROABS 7.2  --   --   HGB 12.0 11.8* 11.8*  HCT 35.7* 35.7* 36.5  MCV  80.6 80.8 81.3  PLT 186 185 207   Basic Metabolic Panel: Recent Labs  Lab 12/09/22 2036 12/12/22 1849 12/13/22 0816  NA 133* 135 139  K 3.6 3.4* 3.6  CL 101 102 103  CO2 23 22 25   GLUCOSE 96 91 81  BUN 16 7 5*  CREATININE 1.14* 0.96 0.77  CALCIUM 9.0 8.9 8.9  MG  --   --  1.7   Liver Function Tests: Recent Labs  Lab 12/09/22 2036 12/13/22 0816  AST 15 13*  ALT 11 9  ALKPHOS 76 67  BILITOT 1.2 0.8  PROT 7.1 7.2  ALBUMIN 3.7 3.2*   CBG: No results for input(s): "GLUCAP" in the last 168 hours.  Discharge time spent: greater than 30 minutes.  Signed: Marrion Coy, MD [facility] 12/13/2022

## 2022-12-14 ENCOUNTER — Ambulatory Visit: Payer: BC Managed Care – PPO | Admitting: Urology

## 2022-12-14 ENCOUNTER — Encounter: Payer: Self-pay | Admitting: Urology

## 2022-12-14 ENCOUNTER — Ambulatory Visit (INDEPENDENT_AMBULATORY_CARE_PROVIDER_SITE_OTHER): Payer: Federal, State, Local not specified - PPO | Admitting: Urology

## 2022-12-14 VITALS — BP 145/82 | HR 84 | Ht 69.0 in | Wt 253.0 lb

## 2022-12-14 DIAGNOSIS — R109 Unspecified abdominal pain: Secondary | ICD-10-CM

## 2022-12-14 DIAGNOSIS — R310 Gross hematuria: Secondary | ICD-10-CM

## 2022-12-14 LAB — URINE CULTURE: Culture: >=100000 — AB

## 2022-12-14 NOTE — Progress Notes (Incomplete)
I, Cynthia Duncan,acting as a scribe for Cynthia Scotland, MD.,have documented all relevant documentation on the behalf of Cynthia Scotland, MD,as directed by  Cynthia Scotland, MD while in the presence of Cynthia Scotland, MD.  12/14/2022 3:35 PM   Cynthia Duncan 1966/03/11 [id number]  Referring provider: Enid Baas, MD [address] [place],  [place] [address]  Chief Complaint  Patient presents with   Recurrent UTI    HPI: 57 year-old female with a complex personal history including Marfan syndrome, aortic dissection, and kidney stones presents today following hospital visit.  She was admitted for pain control of left flank pain and recurrent gross hematuria with clots. She had some fullness in her left kidney and per admission it was theorized she'd passed the stone, but there's no real evidence of that. She does have some hyperattenuating material in her left collecting system. She did have some microscopic blood in her urine during the admission.   In 2018 she had an abnormal CT scan on the left side indicating mild left hydronephrosis. It was felt it could be related to blood or hemorrhage in the left collecting system, but underlying mass couldn't be excluded. She saw Dr. Sherryl Barters who recommended cystoscopy and left ureteroscopy.  This was not completed for unclear reasons.  She reports that she stopped bleeding and was told that she did not need the procedure anymore but there is no documentation of this.  She has an appointment Monday with Dr. Zebedee Iba to discuss another surgery for her dissection because she went from a 4.3 to a 4.9 in less than nine months.  Because the dissection goes into this left arc kidney, some doctors feel like it is possible that it can leak (bleed)  because it's jumped so much, it starts leaking before it ruptures.   Currently she has been seeing clots again.  PMH: Past Medical History:  Diagnosis Date   Anemia    Aortic aneurysm    "The starting of  one in 2012"- found by using dye- 2015- no evidence of aneurysm   Cyst of breast 2016   left   Kidney stones    Marfan syndrome    Ulcerative colitis    Vitamin D deficiency     Surgical History: Past Surgical History:  Procedure Laterality Date   ABDOMINAL AORTIC ANEURYSM REPAIR     COLONOSCOPY  2012   had in [place].- Dx- Ulcerative colitis   HERNIA REPAIR      Home Medications:  Allergies as of 12/14/2022       Reactions   Erythromycin Diarrhea, Other (See Comments)   Severe abdominal pain.   Other Diarrhea, Nausea Only, Nausea And Vomiting   Penicillins Swelling   Has patient had a PCN reaction causing immediate rash, facial/tongue/throat swelling, SOB or lightheadedness with hypotension: Yes Has patient had a PCN reaction causing severe rash involving mucus membranes or skin necrosis: No Has patient had a PCN reaction that required hospitalization No Has patient had a PCN reaction occurring within the last 10 years: No If all of the above answers are "NO", then may proceed with Cephalosporin use.   Quinolones Other (See Comments)   Fluroquinolone antibiotics should be avoided in patients with aortic disease unless alternative therapy is not an option.        Medication List        Accurate as of December 14, 2022  3:35 PM. If you have any questions, ask your nurse or doctor.  albuterol 108 (90 Base) MCG/ACT inhaler Commonly known as: VENTOLIN HFA   aspirin EC 81 MG tablet Take 81 mg by mouth every evening.   atorvastatin 40 MG tablet Commonly known as: LIPITOR Take 40 mg by mouth daily.   carvedilol 3.125 MG tablet Commonly known as: COREG Take 3.125 mg by mouth 2 (two) times daily.   ferrous sulfate 325 (65 FE) MG EC tablet Take 325 mg by mouth daily with breakfast.   fluticasone 50 MCG/ACT nasal spray Commonly known as: FLONASE Place 2 sprays into the nose 2 (two) times daily as needed.   losartan 25 MG tablet Commonly known as: COZAAR Take  1 tablet by mouth daily.   Multi-Vitamins Tabs Take 1 tablet by mouth every morning.   nitrofurantoin 100 MG capsule Commonly known as: MACRODANTIN Take 100 mg by mouth 2 (two) times daily.   oxyCODONE-acetaminophen 5-325 MG tablet Commonly known as: Roxicet Take 1 tablet by mouth every 6 (six) hours as needed.   traMADol 50 MG tablet Commonly known as: ULTRAM Take 50 mg by mouth every 8 (eight) hours as needed.   Vitamin D-3 25 MCG (1000 UT) Caps Take 1,000 Units by mouth every morning.        Allergies:  Allergies  Allergen Reactions   Erythromycin Diarrhea and Other (See Comments)    Severe abdominal pain.   Other Diarrhea, Nausea Only and Nausea And Vomiting   Penicillins Swelling    Has patient had a PCN reaction causing immediate rash, facial/tongue/throat swelling, SOB or lightheadedness with hypotension: Yes Has patient had a PCN reaction causing severe rash involving mucus membranes or skin necrosis: No Has patient had a PCN reaction that required hospitalization No Has patient had a PCN reaction occurring within the last 10 years: No If all of the above answers are "NO", then may proceed with Cephalosporin use.   Quinolones Other (See Comments)    Fluroquinolone antibiotics should be avoided in patients with aortic disease unless alternative therapy is not an option.    Family History: Family History  Problem Relation Age of Onset   Heart disease Father    Tuberculosis Father    Bladder Cancer Neg Hx    Kidney cancer Neg Hx     Social History:  reports that she has never smoked. She has never used smokeless tobacco. She reports that she does not drink alcohol and does not use drugs.   Physical Exam: BP (!) 145/82   Pulse 84   Ht  (1.753 m)   Wt 253 lb (114.8 kg)   BMI 37.36 kg/m   Constitutional:  Alert and oriented, No acute distress. HEENT: [place] AT, moist mucus membranes.  Trachea midline, no masses. Neurologic: Grossly intact, no focal  deficits, moving all 4 extremities. Psychiatric: Normal mood and affect.  Reviewed serial imaging including CT scan abdomen pelvis from 04/2017 as well as more recent studies including CT abdomen with and without contrast on 12/09/2022 and 12/12/2022.  These show abnormality within the left collecting system, likely blood product and suspicious for obstruction from clot.  There are also a few punctate stones but these are stable.  Assessment & Plan:    Gross microscopic hematuria -Do not believe that this is related to kidney stone event - Other causes include: Abnormal renal vasculature or underlying collecting system lesion although -Given that she has had the same issue back in 2018, I have fairly low suspicion for upper tract malignancy although cannot completely exclude this -Recommend  cystoscopy, left retrograde pyelogram and possible diagnostic ureteroscopy given concern for underlying process in the left kidney  - In the interim, she is seeing thoracic surgeon, Dr. Zebedee Iba, this Monday to discuss surgery for her aortic dissection. We will send him information regarding our findings and recommendations. Will f/u his notes from the visit regarding his findings and treatment plan.  At that point will reach out to her how best to proceed in terms of timing of this procedure  2. Left flank pain -She will let us know if she needs more pain medication, believes she currently has some refills  I have reviewed the above documentation for accuracy and completeness, and I agree with the above.   Cynthia Scotland, MD   [facility] [address], [address] [place], [place] [address] [phone]

## 2023-01-26 ENCOUNTER — Ambulatory Visit: Payer: BC Managed Care – PPO | Admitting: Urology

## 2023-03-17 DIAGNOSIS — I7161 Supraceliac aneurysm of the thoracoabdominal aorta, without rupture: Secondary | ICD-10-CM | POA: Insufficient documentation

## 2023-05-27 DIAGNOSIS — I7123 Aneurysm of the descending thoracic aorta, without rupture: Secondary | ICD-10-CM | POA: Insufficient documentation

## 2023-08-05 NOTE — Progress Notes (Signed)
[facility] [address] [place], [place] [address]  Pulmonary Sleep Medicine   Office Visit Note  Patient Name: Cynthia Duncan DOB: [birth year] MRN [id number]    Chief Complaint: Obstructive Sleep Apnea visit  Brief History:  Sammara is seen today for an initial consult on CPAP@ 7 cmH2O. The patient has a 8 year history of sleep apnea. Patient is not using PAP nightly.  She hasn't used machine since 2017.  She states she used machine only a few months due to having chronic bronchitis and it seeming to aggravate this. The patient states she never adjusted to CPAP. She is hoping for an alternative.  Reported sleepiness is  minimal and the Epworth Sleepiness Score is 2 out of 24. The patient does take a nap once or twice a week after a meal.. The patient complains of the following: in the last two months some headaches and heart racing at night while sleeping. Snoring, some daytime sleepiness and fatigue noted.  The patient does not complain of limb movements disrupting sleep. The patient continues to require PAP therapy in order to eliminate sleep apnea. Patient is in need of supplies if she were to resume use of CPAP. She is scheduled for surgery this week and the hospital has asked her to bring her cpap.   ROS  General: (-) fever, (-) chills, (-) night sweat Nose and Sinuses: (-) nasal stuffiness or itchiness, (-) postnasal drip, (-) nosebleeds, (-) sinus trouble. Mouth and Throat: (-) sore throat, (-) hoarseness. Neck: (-) swollen glands, (-) enlarged thyroid, (-) neck pain. Respiratory: - cough, - shortness of breath, - wheezing. Neurologic: - numbness, - tingling. Psychiatric: - anxiety, - depression   Current Medication: Outpatient Encounter Medications as of 08/08/2023  Medication Sig   carvedilol (COREG) 6.25 MG tablet Take by mouth.   gabapentin (NEURONTIN) 300 MG capsule Take 300 mg by mouth 2 (two) times daily.   [DISCONTINUED] furosemide (LASIX) 20 MG tablet Take  20 mg by mouth daily.   [DISCONTINUED] torsemide (DEMADEX) 20 MG tablet Take by mouth.   albuterol (VENTOLIN HFA) 108 (90 Base) MCG/ACT inhaler    aspirin EC 81 MG tablet Take 81 mg by mouth every evening.   atorvastatin (LIPITOR) 40 MG tablet Take 40 mg by mouth daily.   Cholecalciferol (VITAMIN D-3) 1000 UNITS CAPS Take 1,000 Units by mouth every morning.    ferrous sulfate 325 (65 FE) MG EC tablet Take 325 mg by mouth daily with breakfast.   fluticasone (FLONASE) 50 MCG/ACT nasal spray Place 2 sprays into the nose 2 (two) times daily as needed.   losartan (COZAAR) 25 MG tablet Take 1 tablet by mouth daily.   Multiple Vitamin (MULTI-VITAMINS) TABS Take 1 tablet by mouth every morning.   [DISCONTINUED] carvedilol (COREG) 3.125 MG tablet Take 3.125 mg by mouth 2 (two) times daily.   [DISCONTINUED] nitrofurantoin (MACRODANTIN) 100 MG capsule Take 100 mg by mouth 2 (two) times daily.   [DISCONTINUED] oxyCODONE-acetaminophen (ROXICET) 5-325 MG tablet Take 1 tablet by mouth every 6 (six) hours as needed.   [DISCONTINUED] traMADol (ULTRAM) 50 MG tablet Take 50 mg by mouth every 8 (eight) hours as needed.   No facility-administered encounter medications on file as of 08/08/2023.    Surgical History: Past Surgical History:  Procedure Laterality Date   ABDOMINAL AORTIC ANEURYSM REPAIR     COLONOSCOPY  2012   [place] in [place].- Dx- Ulcerative colitis   HERNIA REPAIR      Medical History: Past Medical  History:  Diagnosis Date   Anemia    Aortic aneurysm (HCC)    "The starting of one in 2012"- found by using dye- 2015- no evidence of aneurysm   Cyst of breast 2016   left   Kidney stones    Marfan syndrome    Ulcerative colitis (HCC)    Vitamin D deficiency     Family History: Non contributory to the present illness  Social History: Social History   Socioeconomic History   Marital status: Married    Spouse name: Not on file   Number of children: Not on file   Years of education: Not  on file   Highest education level: Not on file  Occupational History   Not on file  Tobacco Use   Smoking status: Never   Smokeless tobacco: Never  Substance and Sexual Activity   Alcohol use: No    Alcohol/week: 0.0 standard drinks of alcohol   Drug use: No   Sexual activity: Yes  Other Topics Concern   Not on file  Social History Narrative   Not on file   Social Drivers of Health   Financial Resource Strain: Low Risk  (07/12/2023)   Received from [facility]   Overall Financial Resource Strain (CARDIA)    Difficulty of Paying Living Expenses: Not hard at all  Food Insecurity: No Food Insecurity (07/12/2023)   Received from [facility]   Hunger Vital Sign    Worried About Running Out of Food in the Last Year: Never true    Ran Out of Food in the Last Year: Never true  Transportation Needs: No Transportation Needs (07/12/2023)   Received from [facility] - Transportation    In the past 12 months, has lack of transportation kept you from medical appointments or from getting medications?: No    Lack of Transportation (Non-Medical): No  Physical Activity: Not on file  Stress: Not on file  Social Connections: Not on file  Intimate Partner Violence: Not on file    Vital Signs: Blood pressure (!) 140/72, pulse 95, resp. rate 12, height 5\' 9"  (1.753 m), weight 250 lb 1.6 oz (113.4 kg), SpO2 98%. Body mass index is 36.93 kg/m.    Examination: General Appearance: The patient is well-developed, well-nourished, and in no distress. Neck Circumference: 37cm Skin: Gross inspection of skin unremarkable. Head: normocephalic, no gross deformities. Eyes: no gross deformities noted. ENT: ears appear grossly normal Neurologic: Alert and oriented. No involuntary movements.  STOP BANG RISK ASSESSMENT S (snore) Have you been told that you snore?     YES   T (tired) Are you often tired, fatigued, or sleepy during the  day?   NO  O (obstruction) Do you stop breathing, choke, or gasp during sleep? YES   P (pressure) Do you have or are you being treated for high blood pressure? YES   B (BMI) Is your body index greater than 35 kg/m? NO   A (age) Are you 5 years old or older? YES   N (neck) Do you have a neck circumference greater than 16 inches?   NO   G (gender) Are you a female? NO   TOTAL STOP/BANG "YES" ANSWERS 4       A STOP-Bang score of 2 or less is considered low risk, and a score of 5 or more is high risk for having either moderate or severe OSA. For people who score 3 or 4, doctors may  need to perform further assessment to determine how likely they are to have OSA.         EPWORTH SLEEPINESS SCALE:  Scale:  (0)= no chance of dozing; (1)= slight chance of dozing; (2)= moderate chance of dozing; (3)= high chance of dozing  Chance  Situtation    Sitting and reading: 0    Watching TV: 2    Sitting Inactive in public: 0    As a passenger in car: 0      Lying down to rest: 0    Sitting and talking: 0    Sitting quielty after lunch: 0    In a car, stopped in traffic: 0   TOTAL SCORE:   2 out of 24    SLEEP STUDIES:  PSG (04/2015 at [facility]) AHI 17/hr, REM AHI 34/hr, min SpO2 73% Titration (05/2015) CPAP@ 7 cmH2O.   CPAP COMPLIANCE- Not using machine         LABS: No results found for this or any previous visit (from the past 2160 hours).  Radiology: CT Angio Chest/Abd/Pel for Dissection W and/or Wo Contrast Result Date: 12/12/2022 CLINICAL DATA:  Left-sided back pain radiating to the left lower quadrant. EXAM: CT ANGIOGRAPHY CHEST, ABDOMEN AND PELVIS TECHNIQUE: Non-contrast CT of the chest was initially obtained. Multidetector CT imaging through the chest, abdomen and pelvis was performed using the standard protocol during bolus administration of intravenous contrast. Multiplanar reconstructed images and MIPs were obtained and reviewed to evaluate  the vascular anatomy. RADIATION DOSE REDUCTION: This exam was performed according to the departmental dose-optimization program which includes automated exposure control, adjustment of the mA and/or kV according to patient size and/or use of iterative reconstruction technique. CONTRAST:  OMNIPAQUE IOHEXOL 350 MG/ML SOLN COMPARISON:  12/09/2022 CT abdomen pelvis 03/27/2016 CTA chest abdomen pelvis FINDINGS: CTA CHEST FINDINGS Cardiovascular: --Heart: The heart size is normal.  There is nopericardial effusion. --Aorta: There is an aortic dissection that extends in the proximal descending aorta to the level of the renal arteries. The caliber of the descending thoracic aorta has increased since 03/27/2016 and measures 3.7 cm transverse at the level of the diaphragmatic hiatus, previously 2.7 cm at the level of the right pulmonary artery, it measures 4.3 cm transverse, previously 2.9 cm. --Pulmonary Arteries: Contrast timing is optimized for preferential opacification of the aorta. Within that limitation, normal central pulmonary arteries. Mediastinum/Nodes: No mediastinal, hilar or axillary lymphadenopathy. The visualized thyroid and thoracic esophageal course are unremarkable. Lungs/Pleura: No pulmonary nodules or masses. No pleural effusion or pneumothorax. No focal airspace consolidation. No focal pleural abnormality. Musculoskeletal: No chest wall abnormality. No acute osseous findings. Review of the MIP images confirms the above findings. CTA ABDOMEN AND PELVIS FINDINGS VASCULAR Aorta: Dissection extends to the level of the renal arteries. There is aortic atherosclerosis. Celiac: Arises from the true lumen and is widely patent. SMA: Arises from the true lumen and is widely patent. Renals: Single renal arteries bilaterally. Right renal artery arises from the false lumen. Left renal artery arises from the true lumen. IMA: Patent without abnormality. Inflow: Aorto bi-iliac bypass graft with partial occlusion of  the native common iliac arteries and retrograde filling. Veins: Normal course and caliber of the major veins. Assessment is otherwise limited by the arterial dominant contrast phase. Review of the MIP images confirms the above findings. NON-VASCULAR Hepatobiliary: Normal hepatic contours and density. No visible biliary dilatation. Normal gallbladder. Pancreas: Normal contours without ductal dilatation. No peripancreatic fluid collection. Spleen: Normal arterial  phase splenic enhancement pattern. Adrenals/Urinary Tract: --Adrenal glands: Normal. --Right kidney/ureter: No hydronephrosis or perinephric stranding. No nephrolithiasis. No obstructing ureteral stones. --Left kidney/ureter: No hydronephrosis or perinephric stranding. No nephrolithiasis. No obstructing ureteral stones. --Urinary bladder: Unremarkable. Stomach/Bowel: --Stomach/Duodenum: Unchanged mucosal thickening at the pylorus, but the stomach is less distended. --Small bowel: No dilatation or inflammation. --Colon: No focal abnormality. --Appendix: Normal. Lymphatic:  No abdominal or pelvic lymphadenopathy. Reproductive: There are multiple uterine fibroids. IUD in the uterus. Musculoskeletal. No bony spinal canal stenosis or focal osseous abnormality. Other: None. Review of the MIP images confirms the above findings. Review of the MIP images confirms the above findings. IMPRESSION: 1. Aortic dissection that extends from the proximal descending aorta to the level of the renal arteries. The caliber of the descending thoracic aorta has increased since 03/27/2016 and measures 4.3 cm transverse, previously 2.9 cm. 2. Aorto bi-iliac bypass graft with partial occlusion of the native common iliac arteries and retrograde filling. 3. Unchanged appearance of mucosal thickening at the pylorus. Correlation with endoscopy suggested. Aortic Atherosclerosis (ICD10-I70.0). Electronically Signed   By: Deatra Robinson M.D.   On: 12/12/2022 20:34    No results found.  No  results found.    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/2023   GERD (gastroesophageal reflux disease) 12/13/2022   Chronic diastolic CHF (congestive heart failure) (HCC) 12/13/2022   Paroxysmal atrial flutter (HCC) 12/13/2022   Status post aortic dissection repair 12/13/2022   Hypokalemia 12/13/2022   Gross hematuria 12/12/2022   Aneurysm of infrarenal abdominal aorta (HCC) 02/19/2019   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 OSA (obstructive sleep apnea) (Primary) The patient did not  tolerate PAP and has not used it. We had a lengthy discussion of the risks. She is willing to try to restart after our discussion. She would like to have a mask fitting so this will be scheduled. F/u one month.  Moderate OSA- resume use of cpap at 7cm, f/u one month. Mask fitting, new supplies.    2. CPAP use counseling CPAP Counseling: had a lengthy discussion with the patient regarding the importance of PAP therapy in management of the sleep apnea. Patient appears to understand the risk factor reduction and also understands the risks associated with untreated sleep apnea. Patient will try to make a good faith effort to remain compliant with therapy. Also instructed the patient on proper cleaning of the device including the water must be changed daily if possible and use of distilled water  is preferred. Patient understands that the machine should be regularly cleaned with appropriate recommended cleaning solutions that do not damage the PAP machine for example given white vinegar and water rinses. Other methods such as ozone treatment may not be as good as these simple methods to achieve cleaning.   3. Paroxysmal atrial flutter (HCC) Discussed risks of not treating her OSA in light of this.     General Counseling: I  have discussed the findings of the evaluation and examination with Shaylyn.  I have also discussed any further diagnostic evaluation thatmay be needed or ordered today. Latice verbalizes understanding of the findings of todays visit. We also reviewed her medications today and discussed drug interactions and side effects including but not limited excessive drowsiness and altered mental states. We also discussed that there is always a risk not just to her but also people around her. she has been encouraged to call the office with any questions or concerns that should arise related to todays visit.  No orders of the defined types were placed in this encounter.       I have personally obtained a history, examined the patient, evaluated laboratory and imaging results, formulated the assessment and plan and placed orders. This patient was seen today by Emmaline Kluver, PA-C in collaboration with Dr. Freda Munro.   Yevonne Pax, MD [facility] Diplomate ABMS Pulmonary Critical Care Medicine and Sleep Medicine

## 2023-08-08 ENCOUNTER — Ambulatory Visit (INDEPENDENT_AMBULATORY_CARE_PROVIDER_SITE_OTHER): Payer: Federal, State, Local not specified - PPO | Admitting: Internal Medicine

## 2023-08-08 VITALS — BP 140/72 | HR 95 | Resp 12 | Ht 69.0 in | Wt 250.1 lb

## 2023-08-08 DIAGNOSIS — Z7189 Other specified counseling: Secondary | ICD-10-CM

## 2023-08-08 DIAGNOSIS — E669 Obesity, unspecified: Secondary | ICD-10-CM | POA: Insufficient documentation

## 2023-08-08 DIAGNOSIS — I4892 Unspecified atrial flutter: Secondary | ICD-10-CM | POA: Diagnosis not present

## 2023-08-08 DIAGNOSIS — G4733 Obstructive sleep apnea (adult) (pediatric): Secondary | ICD-10-CM | POA: Diagnosis not present

## 2023-08-30 ENCOUNTER — Other Ambulatory Visit: Payer: Self-pay | Admitting: Family Medicine

## 2023-08-30 DIAGNOSIS — R29898 Other symptoms and signs involving the musculoskeletal system: Secondary | ICD-10-CM

## 2023-08-30 DIAGNOSIS — M79602 Pain in left arm: Secondary | ICD-10-CM

## 2023-09-02 ENCOUNTER — Ambulatory Visit
Admission: RE | Admit: 2023-09-02 | Discharge: 2023-09-02 | Disposition: A | Discharge status: Home / Self Care | Payer: Federal, State, Local not specified - PPO | Source: Ambulatory Visit | Attending: Family Medicine | Admitting: Family Medicine

## 2023-09-02 DIAGNOSIS — R29898 Other symptoms and signs involving the musculoskeletal system: Secondary | ICD-10-CM

## 2023-09-02 DIAGNOSIS — M79602 Pain in left arm: Secondary | ICD-10-CM

## 2023-09-07 ENCOUNTER — Ambulatory Visit
Admission: RE | Admit: 2023-09-07 | Discharge: 2023-09-07 | Disposition: A | Discharge status: Home / Self Care | Payer: Federal, State, Local not specified - PPO | Source: Ambulatory Visit | Attending: Family Medicine | Admitting: Family Medicine

## 2023-09-07 ENCOUNTER — Ambulatory Visit
Admission: RE | Admit: 2023-09-07 | Discharge: 2023-09-07 | Discharge status: Home / Self Care | Payer: Federal, State, Local not specified - PPO | Source: Ambulatory Visit | Attending: Family Medicine | Admitting: Family Medicine

## 2023-09-07 DIAGNOSIS — M79602 Pain in left arm: Secondary | ICD-10-CM | POA: Diagnosis present

## 2023-09-07 DIAGNOSIS — R29898 Other symptoms and signs involving the musculoskeletal system: Secondary | ICD-10-CM | POA: Diagnosis present

## 2023-09-07 MED ORDER — GADOBUTROL 1 MMOL/ML IV SOLN
10.0000 mL | Freq: Once | INTRAVENOUS | Status: AC | PRN
  Administered 2023-09-07: 10 mL via INTRAVENOUS

## 2023-09-22 NOTE — Progress Notes (Unsigned)
Referring Physician:  Buren Kos, FNP [address] [place],  [place] [address]  Primary Physician:  Enid Baas, MD  History of Present Illness: 09/22/2023 Ms. Abrish Erny is here today with a chief complaint of ***  left arm weakness, numbness, pain, and paresthesia    PreCharting for nerve patients : EMG? - 08/25/2023 Imaging of Nerve, Ultrasound or MRI? - MRI 09/07/2023 Prior Surgery?-   Intake: Are you Having Pain? If so, where?: *** Are you having weakness? If so, where?: *** Are you having sensory changes? If so where?: *** When did it start?: *** How did it start/happen?: ***  Conservative measures:  Physical therapy: Yes Occupational Therapy: Yes Hand Therapy: Yes Injections: No Gabapentin: Yes, Lyrica: No, Cymbalta: Yes Past Surgery: No  Is this a Second Opinion: {yes/no:20286}  Main Question for Surgeon: ***  Review of Systems:  A 10 point review of systems is negative, except for the pertinent positives and negatives detailed in the HPI.  Past Medical History: Past Medical History:  Diagnosis Date   Anemia    Aortic aneurysm (HCC)    "The starting of one in 2012"- found by using dye- 2015- no evidence of aneurysm   Cyst of breast 2016   left   Kidney stones    Marfan syndrome    Ulcerative colitis (HCC)    Vitamin D deficiency     Past Surgical History: Past Surgical History:  Procedure Laterality Date   ABDOMINAL AORTIC ANEURYSM REPAIR     COLONOSCOPY  [place]   [place] in [place].- Dx- Ulcerative colitis   HERNIA REPAIR      Allergies: Allergies as of 09/23/2023 - Review Complete 08/08/2023  Allergen Reaction Noted   Erythromycin Diarrhea and Other (See Comments) 03/20/2015   Other Diarrhea, Nausea Only, and Nausea And Vomiting 08/27/2021   Penicillins Swelling 03/20/2015   Quinolones Other (See Comments) 03/01/2022    Medications:  Current Outpatient Medications:    albuterol (VENTOLIN HFA) 108 (90 Base) MCG/ACT  inhaler, , Disp: , Rfl:    aspirin EC 81 MG tablet, Take 81 mg by mouth every evening., Disp: , Rfl:    atorvastatin (LIPITOR) 40 MG tablet, Take 40 mg by mouth daily., Disp: , Rfl:    carvedilol (COREG) 6.25 MG tablet, Take by mouth., Disp: , Rfl:    Cholecalciferol (VITAMIN D-3) 1000 UNITS CAPS, Take 1,000 Units by mouth every morning. , Disp: , Rfl:    ferrous sulfate 325 (65 FE) MG EC tablet, Take 325 mg by mouth daily with breakfast., Disp: , Rfl:    fluticasone (FLONASE) 50 MCG/ACT nasal spray, Place 2 sprays into the nose 2 (two) times daily as needed., Disp: , Rfl:    gabapentin (NEURONTIN) 300 MG capsule, Take 300 mg by mouth 2 (two) times daily., Disp: , Rfl:    losartan (COZAAR) 25 MG tablet, Take 1 tablet by mouth daily., Disp: , Rfl:    Multiple Vitamin (MULTI-VITAMINS) TABS, Take 1 tablet by mouth every morning., Disp: , Rfl:   Social History: Social History   Tobacco Use   Smoking status: Never   Smokeless tobacco: Never  Substance Use Topics   Alcohol use: No    Alcohol/week: 0.0 standard drinks of alcohol   Drug use: No    Family Medical History: Family History  Problem Relation Age of Onset   Heart disease Father    Tuberculosis Father    Bladder Cancer Neg Hx    Kidney cancer Neg  Hx     Physical Examination: There were no vitals filed for this visit.  General: Patient is in no apparent distress. Attention to examination is appropriate.  Neck:   Supple.  Full range of motion.  Respiratory: Patient is breathing without any difficulty.   NEUROLOGICAL:     Awake, alert, oriented to person, place, and time.  Speech is clear and fluent.   Cranial Nerves: Pupils equal round and reactive to light.  Facial tone is symmetric. Shoulder shrug is symmetric. Tongue protrusion is midline.  There is no pronator drift.  Motor Exam:  ***  Reflexes are ***2+ and symmetric at the biceps, triceps, brachioradialis, patella and achilles.   Hoffman's is absent. Clonus is  Absent  Bilateral upper and lower extremity sensation is intact to light touch ***.     Gait is normal.  ***   Medical Decision Making  Imaging: ***  Electrodiagnostics: ***  I have personally reviewed the images and electrodiagnostics and agree with the above interpretation.  Assessment and Plan: Ms. Hanif is a pleasant 58 y.o. female with ***. The symptoms are causing a significant impact on the patient's life. I have utilized the care everywhere function in epic to review the outside records available from external health systems, and have personally reviewed relevant imaging and electrodiagnostic workup.    Thank you for involving me in the care of this patient.    Lovenia Kim MD/MSCR Neurosurgery - Peripheral Nerve Surgery

## 2023-09-23 ENCOUNTER — Telehealth: Payer: Federal, State, Local not specified - PPO | Admitting: Neurosurgery

## 2023-10-15 IMAGING — US US PELVIS COMPLETE TRANSABD/TRANSVAG W DUPLEX
1 series · 13 of 25 positions shown · non-contrast
Comparison: No priors.

CLINICAL DATA: 55-year-old female with history of heavy vaginal
bleeding and cramping for the past 4 days. History of fibroids.

EXAM:
TRANSABDOMINAL AND TRANSVAGINAL ULTRASOUND OF PELVIS
DOPPLER ULTRASOUND OF OVARIES
TECHNIQUE: Both transabdominal and transvaginal ultrasound examinations of the
pelvis were performed. Transabdominal technique was performed for
global imaging of the pelvis including uterus, ovaries, adnexal
regions, and pelvic cul-de-sac.
It was necessary to proceed with endovaginal exam following the
transabdominal exam to visualize the endometrium and adnexal
regions. Color and duplex Doppler ultrasound was utilized to
evaluate blood flow to the ovaries.

[Series 1: us pelvic complete w transvaginal and torsion righ · 13 of 106 slices shown]
[im 1/106]
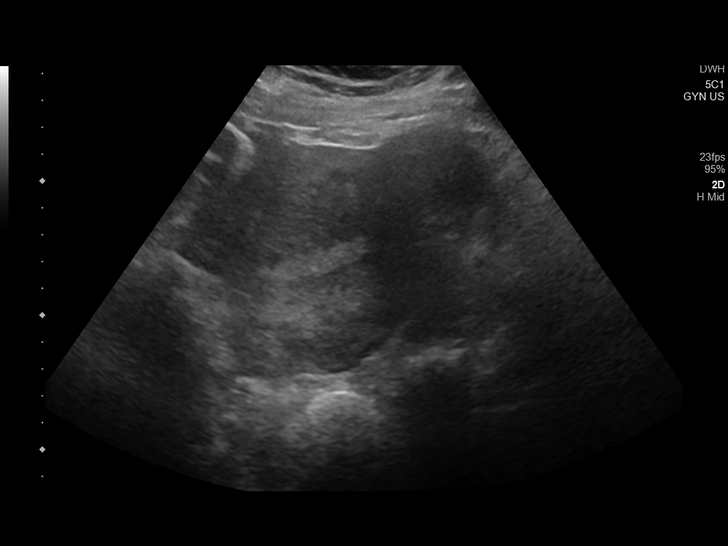
[im 9/106]
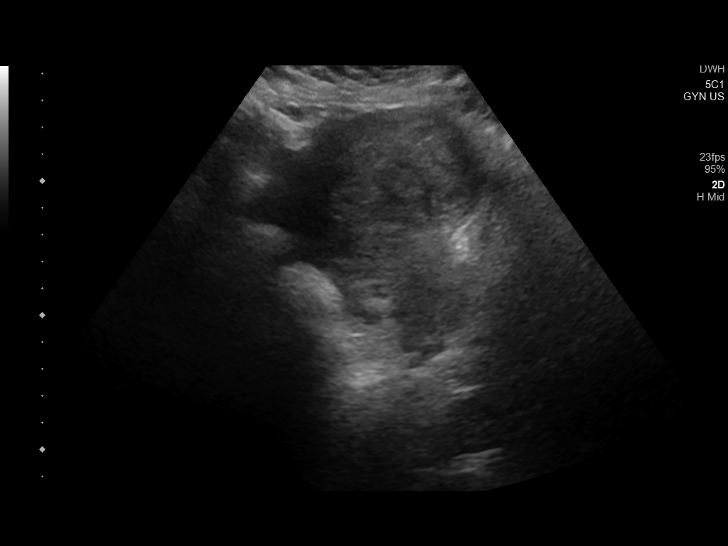
[im 18/106]
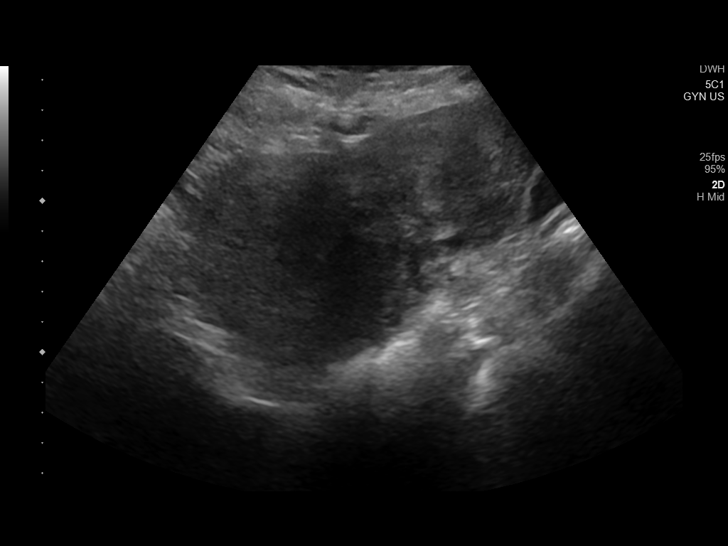
[im 27/106]
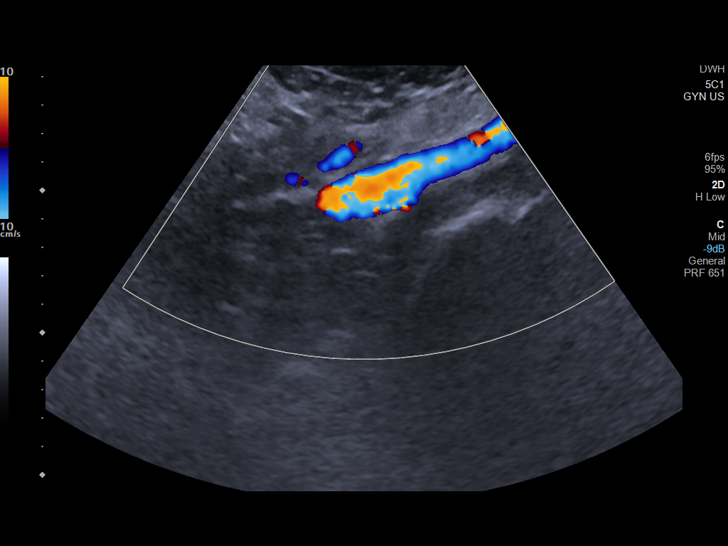
[im 36/106]
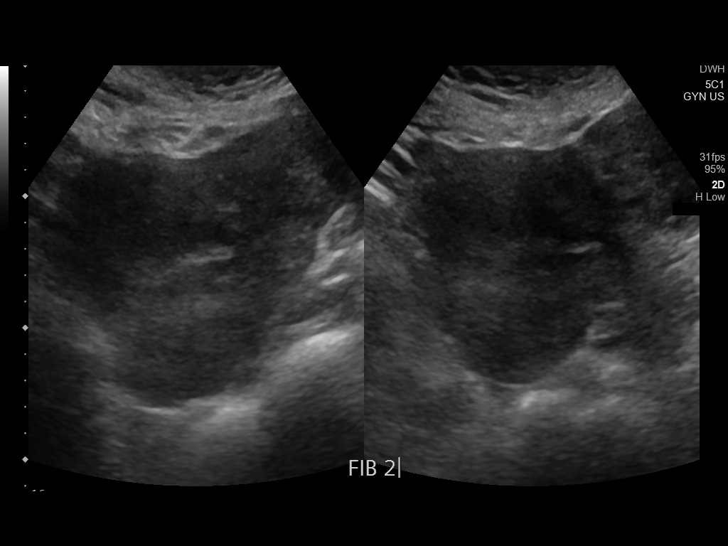
[im 44/106]
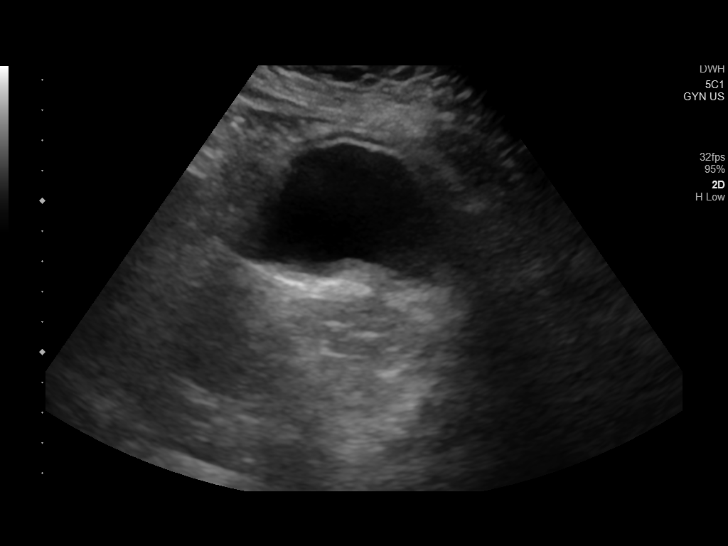
[im 53/106]
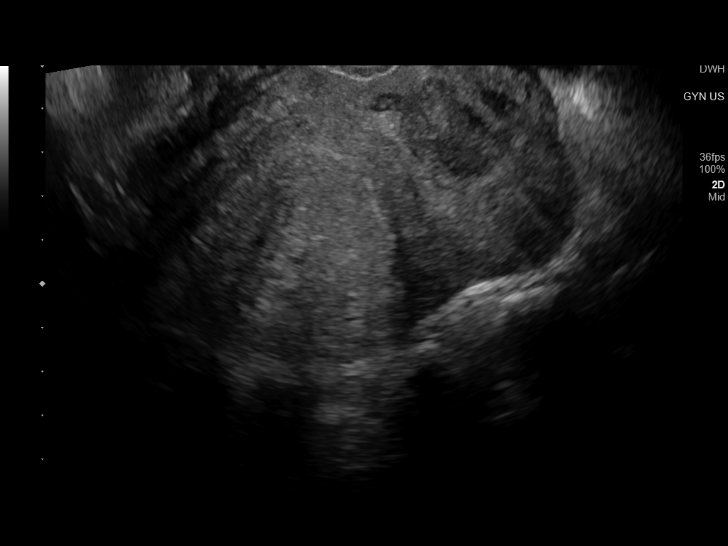
[im 62/106]
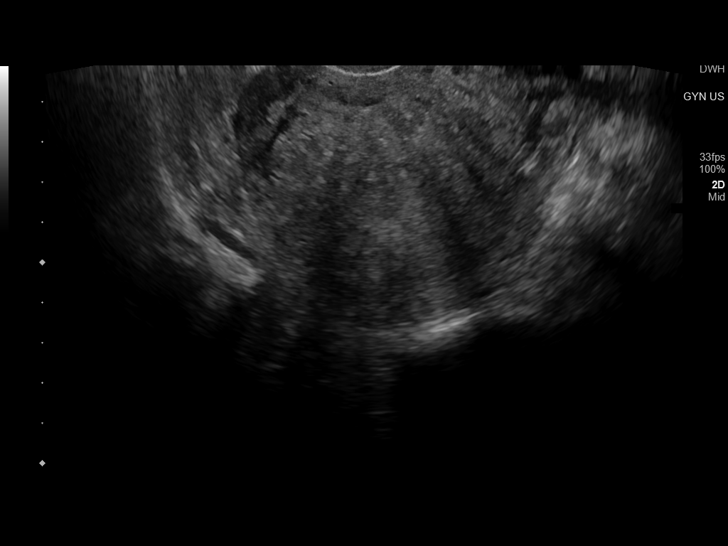
[im 71/106]
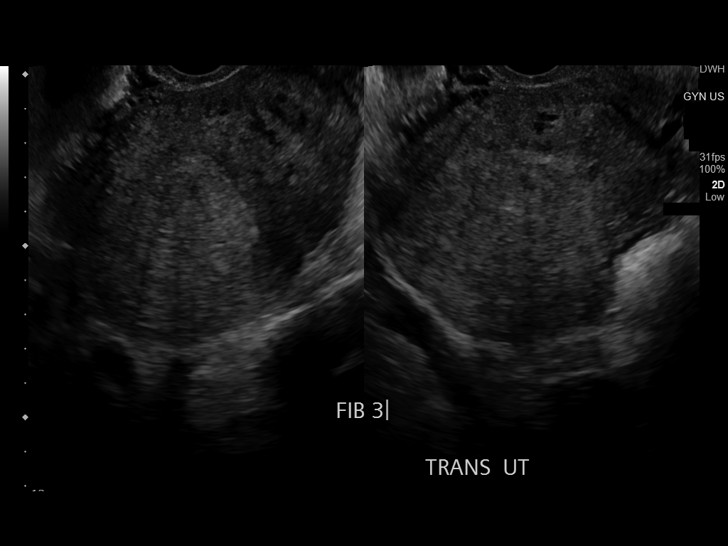
[im 79/106]
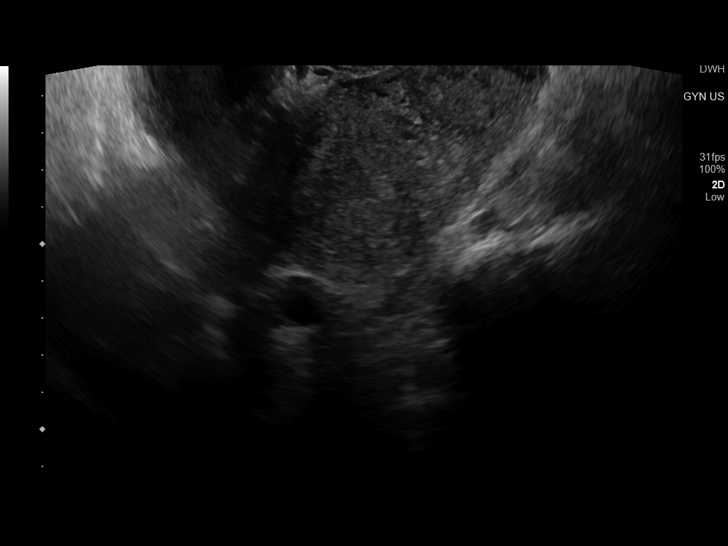
[im 88/106]
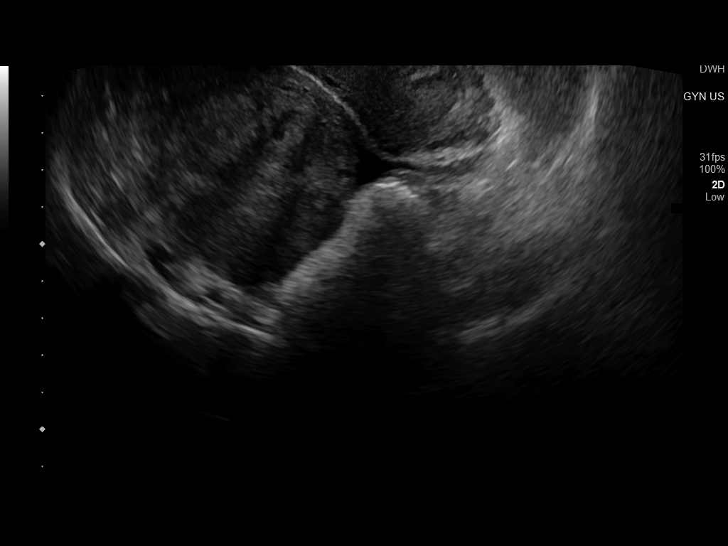
[im 97/106]
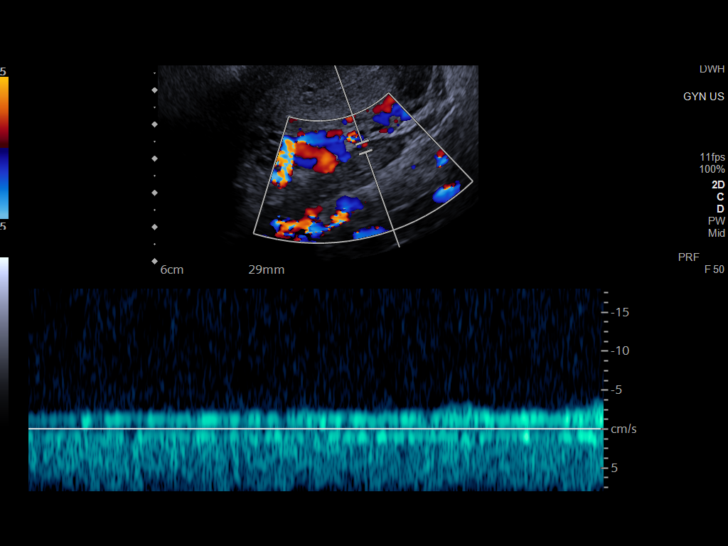
[im 106/106]
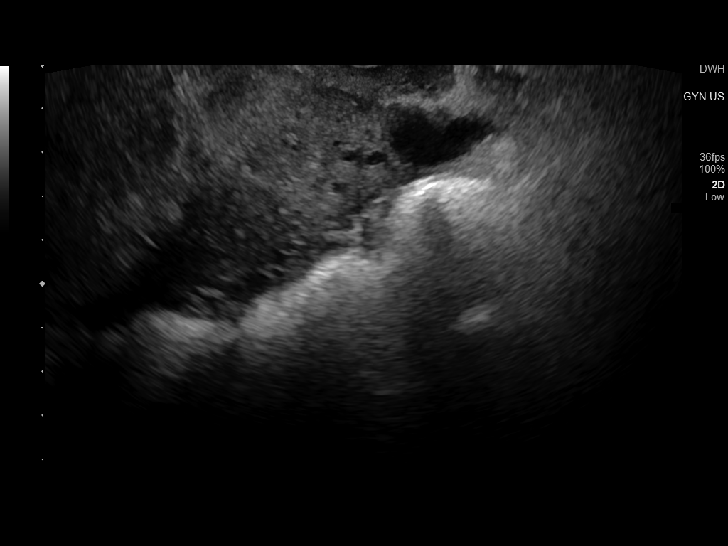

[13 of 25 positions shown; findings below may reference images not displayed]

FINDINGS: Uterus

Measurements: 10.8 x 8.7 x 9.1 cm = volume: 442 mL. Multiple lesions
of heterogeneous internal echotexture, compatible with multifocal
fibroids. These measure 5.5 x 7.4 x 5.1 cm in the left side of the
fundus where there is an exophytic subserosal fibroid, 6.1 x 3.5 x
3.9 cm in the lower uterine segment, and 4.7 x 5.7 x 6.1 cm in the
posterior aspect of the uterine fundus.

Endometrium

Thickness: 9.4 mm.  No focal abnormality visualized.

Right ovary

Could not be visualized

Left ovary

Measurements: 3.2 x 1.2 x 2.2 cm = volume: 4.3 mL. Normal
appearance/no adnexal mass.

Pulsed Doppler evaluation of the left ovary demonstrates normal
low-resistance arterial and venous waveforms.

Other findings

Trace volume of free fluid in the cul-de-sac.
IMPRESSION: 1. Fibroid uterus, as above.
2. Right ovary could not be visualized on today's examination.
3. Normal appearance of the left ovary.
4. Trace volume of free fluid in the cul-de-sac.

## 2024-05-18 ENCOUNTER — Other Ambulatory Visit: Payer: Self-pay

## 2024-05-18 DIAGNOSIS — R1011 Right upper quadrant pain: Secondary | ICD-10-CM

## 2024-05-23 ENCOUNTER — Ambulatory Visit
Admission: RE | Admit: 2024-05-23 | Discharge: 2024-05-23 | Disposition: A | Discharge status: Home / Self Care | Source: Ambulatory Visit

## 2024-05-23 DIAGNOSIS — R1011 Right upper quadrant pain: Secondary | ICD-10-CM | POA: Insufficient documentation
